# Patient Record
Sex: Male | Born: 1945 | Race: White | Hispanic: No | Marital: Married | Smoking: Current every day smoker
Health system: Southern US, Community
[De-identification: ages and names within clinical notes are randomized; demographics above are authoritative.]

## PROBLEM LIST (undated history)

## (undated) DIAGNOSIS — M549 Dorsalgia, unspecified: Secondary | ICD-10-CM

## (undated) DIAGNOSIS — C801 Malignant (primary) neoplasm, unspecified: Secondary | ICD-10-CM

## (undated) DIAGNOSIS — D649 Anemia, unspecified: Secondary | ICD-10-CM

## (undated) DIAGNOSIS — C189 Malignant neoplasm of colon, unspecified: Secondary | ICD-10-CM

## (undated) DIAGNOSIS — E785 Hyperlipidemia, unspecified: Secondary | ICD-10-CM

## (undated) DIAGNOSIS — C61 Malignant neoplasm of prostate: Secondary | ICD-10-CM

## (undated) DIAGNOSIS — I10 Essential (primary) hypertension: Secondary | ICD-10-CM

## (undated) HISTORY — DX: Hyperlipidemia, unspecified: E78.5

## (undated) HISTORY — PX: TRANSURETHRAL RESECTION OF PROSTATE: SHX73

## (undated) HISTORY — PX: CATARACT EXTRACTION, BILATERAL: SHX1313

## (undated) HISTORY — DX: Malignant (primary) neoplasm, unspecified: C80.1

## (undated) HISTORY — PX: CYSTOSCOPY: SUR368

## (undated) HISTORY — DX: Anemia, unspecified: D64.9

---

## 2011-05-29 HISTORY — PX: COLON SURGERY: SHX602

## 2011-07-04 HISTORY — PX: THYROIDECTOMY: SHX17

## 2012-01-24 LAB — HM COLONOSCOPY

## 2013-07-07 ENCOUNTER — Other Ambulatory Visit: Payer: Self-pay | Admitting: Orthopedic Surgery

## 2013-07-07 DIAGNOSIS — M545 Low back pain, unspecified: Secondary | ICD-10-CM

## 2013-07-13 ENCOUNTER — Ambulatory Visit
Admission: RE | Admit: 2013-07-13 | Discharge: 2013-07-13 | Disposition: A | Discharge status: Home / Self Care | Payer: MEDICARE | Source: Ambulatory Visit | Attending: Orthopedic Surgery | Admitting: Orthopedic Surgery

## 2013-07-13 DIAGNOSIS — M545 Low back pain, unspecified: Secondary | ICD-10-CM

## 2013-07-17 DIAGNOSIS — M961 Postlaminectomy syndrome, not elsewhere classified: Secondary | ICD-10-CM | POA: Insufficient documentation

## 2013-09-23 ENCOUNTER — Emergency Department (HOSPITAL_COMMUNITY)
Admission: EM | Admit: 2013-09-23 | Discharge: 2013-09-23 | Disposition: A | Discharge status: Home / Self Care | Payer: MEDICARE | Attending: Emergency Medicine | Admitting: Emergency Medicine

## 2013-09-23 ENCOUNTER — Emergency Department (HOSPITAL_COMMUNITY): Payer: MEDICARE

## 2013-09-23 ENCOUNTER — Encounter (HOSPITAL_COMMUNITY): Payer: Self-pay | Admitting: Emergency Medicine

## 2013-09-23 DIAGNOSIS — Z79899 Other long term (current) drug therapy: Secondary | ICD-10-CM | POA: Insufficient documentation

## 2013-09-23 DIAGNOSIS — F29 Unspecified psychosis not due to a substance or known physiological condition: Secondary | ICD-10-CM | POA: Insufficient documentation

## 2013-09-23 DIAGNOSIS — R443 Hallucinations, unspecified: Secondary | ICD-10-CM | POA: Insufficient documentation

## 2013-09-23 DIAGNOSIS — R4182 Altered mental status, unspecified: Secondary | ICD-10-CM | POA: Insufficient documentation

## 2013-09-23 DIAGNOSIS — R209 Unspecified disturbances of skin sensation: Secondary | ICD-10-CM | POA: Insufficient documentation

## 2013-09-23 DIAGNOSIS — M549 Dorsalgia, unspecified: Secondary | ICD-10-CM | POA: Insufficient documentation

## 2013-09-23 DIAGNOSIS — R509 Fever, unspecified: Secondary | ICD-10-CM | POA: Insufficient documentation

## 2013-09-23 DIAGNOSIS — Z87891 Personal history of nicotine dependence: Secondary | ICD-10-CM | POA: Insufficient documentation

## 2013-09-23 DIAGNOSIS — I10 Essential (primary) hypertension: Secondary | ICD-10-CM | POA: Insufficient documentation

## 2013-09-23 HISTORY — DX: Dorsalgia, unspecified: M54.9

## 2013-09-23 HISTORY — DX: Essential (primary) hypertension: I10

## 2013-09-23 LAB — CBC WITH DIFFERENTIAL/PLATELET
BASOS ABS: 0 10*3/uL (ref 0.0–0.1)
BASOS PCT: 0 % (ref 0–1)
EOS ABS: 0.5 10*3/uL (ref 0.0–0.7)
Eosinophils Relative: 4 % (ref 0–5)
HCT: 28.9 % — ABNORMAL LOW (ref 39.0–52.0)
Hemoglobin: 9.6 g/dL — ABNORMAL LOW (ref 13.0–17.0)
Lymphocytes Relative: 18 % (ref 12–46)
Lymphs Abs: 2.2 10*3/uL (ref 0.7–4.0)
MCH: 29.1 pg (ref 26.0–34.0)
MCHC: 33.2 g/dL (ref 30.0–36.0)
MCV: 87.6 fL (ref 78.0–100.0)
Monocytes Absolute: 1.1 10*3/uL — ABNORMAL HIGH (ref 0.1–1.0)
Monocytes Relative: 9 % (ref 3–12)
NEUTROS ABS: 8.4 10*3/uL — AB (ref 1.7–7.7)
NEUTROS PCT: 69 % (ref 43–77)
PLATELETS: 484 10*3/uL — AB (ref 150–400)
RBC: 3.3 MIL/uL — ABNORMAL LOW (ref 4.22–5.81)
RDW: 13.6 % (ref 11.5–15.5)
WBC: 12.2 10*3/uL — ABNORMAL HIGH (ref 4.0–10.5)

## 2013-09-23 LAB — COMPREHENSIVE METABOLIC PANEL
ALBUMIN: 2.9 g/dL — AB (ref 3.5–5.2)
ALT: 26 U/L (ref 0–53)
AST: 22 U/L (ref 0–37)
Alkaline Phosphatase: 67 U/L (ref 39–117)
BUN: 19 mg/dL (ref 6–23)
CHLORIDE: 99 meq/L (ref 96–112)
CO2: 25 mEq/L (ref 19–32)
Calcium: 8.9 mg/dL (ref 8.4–10.5)
Creatinine, Ser: 1.04 mg/dL (ref 0.50–1.35)
GFR calc Af Amer: 83 mL/min — ABNORMAL LOW (ref >90)
GFR calc non Af Amer: 72 mL/min — ABNORMAL LOW (ref >90)
Glucose, Bld: 97 mg/dL (ref 70–99)
POTASSIUM: 4.8 meq/L (ref 3.7–5.3)
SODIUM: 137 meq/L (ref 137–147)
TOTAL PROTEIN: 6.6 g/dL (ref 6.0–8.3)
Total Bilirubin: <0.2 mg/dL — ABNORMAL LOW (ref 0.3–1.2)

## 2013-09-23 LAB — RAPID URINE DRUG SCREEN, HOSP PERFORMED
AMPHETAMINES: NOT DETECTED
BENZODIAZEPINES: NOT DETECTED
Barbiturates: NOT DETECTED
Cocaine: NOT DETECTED
OPIATES: NOT DETECTED
TETRAHYDROCANNABINOL: NOT DETECTED

## 2013-09-23 LAB — URINALYSIS, ROUTINE W REFLEX MICROSCOPIC
BILIRUBIN URINE: NEGATIVE
Glucose, UA: NEGATIVE mg/dL
HGB URINE DIPSTICK: NEGATIVE
Ketones, ur: NEGATIVE mg/dL
Leukocytes, UA: NEGATIVE
Nitrite: NEGATIVE
PH: 7 (ref 5.0–8.0)
Protein, ur: NEGATIVE mg/dL
SPECIFIC GRAVITY, URINE: 1.023 (ref 1.005–1.030)
Urobilinogen, UA: 0.2 mg/dL (ref 0.0–1.0)

## 2013-09-23 LAB — TSH: TSH: 0.224 u[IU]/mL — ABNORMAL LOW (ref 0.350–4.500)

## 2013-09-23 LAB — I-STAT VENOUS BLOOD GAS, ED
Acid-Base Excess: 2 mmol/L (ref 0.0–2.0)
BICARBONATE: 24.8 meq/L — AB (ref 20.0–24.0)
O2 Saturation: 99 %
PCO2 VEN: 32.8 mmHg — AB (ref 45.0–50.0)
PO2 VEN: 125 mmHg — AB (ref 30.0–45.0)
TCO2: 26 mmol/L (ref 0–100)
pH, Ven: 7.486 — ABNORMAL HIGH (ref 7.250–7.300)

## 2013-09-23 LAB — ETHANOL: Alcohol, Ethyl (B): <11 mg/dL (ref 0–11)

## 2013-09-23 LAB — I-STAT CG4 LACTIC ACID, ED: Lactic Acid, Venous: 1.12 mmol/L (ref 0.5–2.2)

## 2013-09-23 LAB — T4, FREE: FREE T4: 1.24 ng/dL (ref 0.80–1.80)

## 2013-09-23 LAB — AMMONIA: Ammonia: 16 umol/L (ref 11–60)

## 2013-09-23 MED ORDER — GADOBENATE DIMEGLUMINE 529 MG/ML IV SOLN
20.0000 mL | Freq: Once | INTRAVENOUS | Status: AC
  Administered 2013-09-23: 20 mL via INTRAVENOUS

## 2013-09-23 NOTE — ED Notes (Signed)
NOTIFIED DR. Jeneen Rinks IN PERSON OF PATIENTS LAB RESULTS OF CG4+ LACTIC ACIS ,@17 :43 PM ,09/23/2013.

## 2013-09-23 NOTE — ED Notes (Signed)
Per pt's wife, pt wanting to leave AMA.  Resident made aware.

## 2013-09-23 NOTE — ED Notes (Signed)
Pt presents to department for evaluation of altered mental status. Recently had spinal fusion surgery last Monday at [facility]. Wife now states he is confused, and not acting like himself. Pt states 3/10 back pain upon arrival. Pt is alert and able to answer questions correctly in triage.

## 2013-09-23 NOTE — ED Notes (Signed)
Dr. James at bedside  

## 2013-09-23 NOTE — ED Provider Notes (Signed)
CSN: [id number]     Arrival date & time 09/23/13  1505 History   First MD Initiated Contact with Patient 09/23/13 1524     Chief Complaint  Patient presents with  . Altered Mental Status     (Consider location/radiation/quality/duration/timing/severity/associated sxs/prior Treatment) HPI  This is a 68 y.o. male with PMH of thyroid cancer status post thyroidectomy, colon cancer status post colectomy, chronic back pain 9 days status post fusion of lumbar, sacral spine, presenting with altered mental status. Time of onset 2 days ago. Mostly occurs at home.  Described as hallucinations, delusions. Examples of hallucinations are "voices," and "seeing my dead daughter hug my wife."  Delusions include that people are attempting to tie him down to chairs.  Of note, tomorrow marks the patient's deceased daughter's birthday. Also, patient quit smoking on the day of his surgery. The complaints do not fit any temporal pattern. They do not seem to be aggravated or alleviated with any other factors. He denies headache, vision change, which weakness, change in bowel or bladder function, chest pain, shortness breath, abdominal pain, nausea, or vomiting. He does endorse some numbness in the right alert surety. He does endorse fever, chills. 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 Negative for head trauma.  Past Medical History  Diagnosis Date  . Back pain   . Hypertension    History reviewed. No pertinent past surgical history. No family history on file. History  Substance Use Topics  . Smoking status: Former Smoker    Types: Cigarettes  . Smokeless tobacco: Not on file  . Alcohol Use: No    Review of Systems  Constitutional: Positive for fever and chills.  HENT: Negative for facial swelling.   Eyes: Negative for pain.  Respiratory: Negative for chest tightness and shortness of breath.   Cardiovascular: Negative for chest pain.   Gastrointestinal: Negative for nausea and vomiting.  Genitourinary: Negative for dysuria.  Musculoskeletal: Positive for back pain.  Neurological: Negative for headaches.  Psychiatric/Behavioral: Positive for hallucinations and confusion. The patient is hyperactive.       Allergies  Review of patient's allergies indicates no known allergies.  Home Medications   Prior to Admission medications   Not on File   BP 95/53  Pulse 77  Temp(Src) 99.1 F (37.3 C) (Rectal)  Resp 18  SpO2 96% Physical Exam  Constitutional: He is oriented to person, place, and time. He appears well-developed and well-nourished. No distress.  HENT:  Head: Normocephalic and atraumatic.  Mouth/Throat: No oropharyngeal exudate.  Eyes: Conjunctivae are normal. Pupils are equal, round, and reactive to light. No scleral icterus.  Neck: Normal range of motion. No tracheal deviation present. No thyromegaly present.  Cardiovascular: Normal rate, regular rhythm and normal heart sounds.  Exam reveals no gallop and no friction rub.   No murmur heard. Pulmonary/Chest: Effort normal and breath sounds normal. No stridor. No respiratory distress. He has no wheezes. He has no rales. He exhibits no tenderness.  Abdominal: Soft. He exhibits no distension and no mass. There is no tenderness. There is no rebound and no guarding.  Musculoskeletal: Normal range of motion. He exhibits no edema.  Neurological: He is alert and oriented to person, place, and time. He has normal strength. A sensory deficit ( decreased sensation of right foot) is present. No cranial nerve deficit. Coordination and gait normal. GCS eye subscore is 4. GCS verbal subscore is 5. GCS motor subscore is 6.  Reflex Scores:      Patellar reflexes are 3+ on the right side and 0 on the left side. Skin: Skin is warm and dry. He is not diaphoretic.  Drain site in the left lumbar paraspinal area is well healed, dry, without signs of infection. The long midline  fusion surgical site is well approximated, appears to be healing well. About midway down the incision, positive for tenderness to palpation, erythema, minor swelling. Negative for drainage.    ED Course  Procedures (including critical care time) Labs Review Labs Reviewed  CBC WITH DIFFERENTIAL  COMPREHENSIVE METABOLIC PANEL  TSH  T4, FREE  URINE RAPID DRUG SCREEN (HOSP PERFORMED)  ETHANOL  AMMONIA  BLOOD GAS, VENOUS  URINALYSIS, ROUTINE W REFLEX MICROSCOPIC  I-STAT CG4 LACTIC ACID, ED    Imaging Review No results found.   EKG Interpretation None      MDM   Final diagnoses:  None    This is a 68 y.o. male with PMH of thyroid cancer status post thyroidectomy, colon cancer status post colectomy, chronic back pain 9 days status post fusion of lumbar, sacral spine, presenting with altered mental status. Time of onset 2 days ago. Mostly occurs at home.  Described as hallucinations, delusions. Examples of hallucinations are "voices," and "seeing my dead daughter hug my wife."  Delusions include that people are attempting to tie him down to chairs.  Of note, tomorrow marks the patient's deceased daughter's birthday. Also, patient quit smoking on the day of his surgery. The complaints do not fit any temporal pattern. They do not seem to be aggravated or alleviated with any other factors. He denies headache, vision change, which weakness, change in bowel or bladder function, chest pain, shortness breath, abdominal pain, nausea, or vomiting. He does endorse some numbness in the right alert surety. He does endorse fever, chills. 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 Negative for head trauma.  Examination as above. Vital signs are within normal limits, including a normal rectal temperature. Neurologic exam is grossly within normal limits, with the exception of some decreased sensation in the right lower extremity, as well as  decreased patellar reflex in the left alert surety. Patient ambulates without complication. The surgical site is grossly normal except for an area of tenderness to palpation, minor swelling.  Differential diagnosis is broad at this time but includes acidosis, infection, stroke, withdrawal, intoxication, cardiac etiology. I strongly cardiac or stroke as the etiology at this time. Considering recent surgery, findings on exam, history of cancer, will obtain CT head. Will also obtain chest x-ray, EKG, basic labs, thyroid testing, testing of urine, MRI of the lumbar spine with contrast. At this time, patient is exhibiting no abnormalities in his mental status, hallucinations, or delusions. I will continue to monitor closely, followup on testing.  Patient is not acidotic, has no evidence of urinary tract infection, is afebrile, has low TSH, but normal free T4. His drug test is negative. Chest x-ray is normal. MRI reveals no gross abnormalities of the spinal canal, but does reveal small amount of fluid in the soft tissues along the operative approach.  On reevaluation, patient now states that he was taking oxycodone for short amount of time, but has not in 3-4 days. This does coincide with the time of onset of his symptoms. I've spoken with Dr. Patrice Paradise with the patient's spine group and he does not believe that this presentation, along with the MRI represents an  infectious process of the surgical site. I agree with this. His office will see the patient tomorrow. I presented to bedside and given the patient and his wife updates. I believe that he is stable for discharge, as long as he follows up tomorrow with the spine clinic, his PCP within the next few days. They agree to this.  Pt stable for discharge, FU.  All questions answered.  Return precautions given.  I have discussed case and care has been guided by my attending physician, Dr. Jeneen Rinks.  Doy Hutching, MD 09/24/13 [phone]

## 2013-09-23 NOTE — ED Notes (Signed)
Rounded on pt.  Pt continues to be in MRI.

## 2013-09-23 NOTE — Discharge Instructions (Signed)
Altered Mental Status °Altered mental status most often refers to an abnormal change in your responsiveness and awareness. It can affect your speech, thought, mobility, memory, attention span, or alertness. It can range from slight confusion to complete unresponsiveness (coma). Altered mental status can be a sign of a serious underlying medical condition. Rapid evaluation and medical treatment is necessary for patients having an altered mental status. °CAUSES  °· Low blood sugar (hypoglycemia) or diabetes. °· Severe loss of body fluids (dehydration) or a body salt (electrolyte) imbalance. °· A stroke or other neurologic problem, such as dementia or delirium. °· A head injury or tumor. °· A drug or alcohol overdose. °· Exposure to toxins or poisons. °· Depression, anxiety, and stress. °· A low oxygen level (hypoxia). °· An infection. °· Blood loss. °· Twitching or shaking (seizure). °· Heart problems, such as heart attack or heart rhythm problems (arrhythmias). °· A body temperature that is too low or too high (hypothermia or hyperthermia). °DIAGNOSIS  °A diagnosis is based on your history, symptoms, physical and neurologic examinations, and diagnostic tests. Diagnostic tests may include: °· Measurement of your blood pressure, pulse, breathing, and oxygen levels (vital signs). °· Blood tests. °· Urine tests. °· X-ray exams. °· A computerized magnetic scan (magnetic resonance imaging, MRI). °· A computerized X-ray scan (computed tomography, CT scan). °TREATMENT  °Treatment will depend on the cause. Treatment may include: °· Management of an underlying medical or mental health condition. °· Critical care or support in the hospital. °HOME CARE INSTRUCTIONS  °· Only take over-the-counter or prescription medicines for pain, discomfort, or fever as directed by your caregiver. °· Manage underlying conditions as directed by your caregiver. °· Eat a healthy, well-balanced diet to maintain strength. °· Join a support group or  prevention program to cope with the condition or trauma that caused the altered mental status. Ask your caregiver to help choose a program that works for you. °· Follow up with your caregiver for further examination, therapy, or testing as directed. °SEEK MEDICAL CARE IF:  °· You feel unwell or have chills. °· You or your family notice a change in your behavior or your alertness. °· You have trouble following your caregiver's treatment plan. °· You have questions or concerns. °SEEK IMMEDIATE MEDICAL CARE IF:  °· You have a rapid heartbeat or have chest pain. °· You have difficulty breathing. °· You have a fever. °· You have a headache with a stiff neck. °· You cough up blood. °· You have blood in your urine or stool. °· You have severe agitation or confusion. °MAKE SURE YOU:  °· Understand these instructions. °· Will watch your condition. °· Will get help right away if you are not doing well or get worse. °Document Released: 11/01/2009 Document Revised: 08/06/2011 Document Reviewed: 11/01/2009 °[employer]® Patient Information ©2014 [employer]. ° °

## 2013-09-23 NOTE — ED Notes (Signed)
Dr. Sherlyn Lick paged to [facility] @ [phone] Rhea Bleacher

## 2013-09-23 NOTE — ED Notes (Addendum)
Pt back from MRI 

## 2013-09-29 ENCOUNTER — Other Ambulatory Visit (INDEPENDENT_AMBULATORY_CARE_PROVIDER_SITE_OTHER): Payer: MEDICARE

## 2013-09-29 ENCOUNTER — Encounter: Payer: Self-pay | Admitting: Internal Medicine

## 2013-09-29 ENCOUNTER — Ambulatory Visit (INDEPENDENT_AMBULATORY_CARE_PROVIDER_SITE_OTHER): Payer: MEDICARE | Admitting: Internal Medicine

## 2013-09-29 VITALS — BP 142/80 | HR 71 | Temp 98.1°F | Resp 16 | Ht 71.0 in | Wt 207.5 lb

## 2013-09-29 DIAGNOSIS — N138 Other obstructive and reflux uropathy: Secondary | ICD-10-CM

## 2013-09-29 DIAGNOSIS — Z85048 Personal history of other malignant neoplasm of rectum, rectosigmoid junction, and anus: Secondary | ICD-10-CM | POA: Insufficient documentation

## 2013-09-29 DIAGNOSIS — E039 Hypothyroidism, unspecified: Secondary | ICD-10-CM | POA: Insufficient documentation

## 2013-09-29 DIAGNOSIS — Z85850 Personal history of malignant neoplasm of thyroid: Secondary | ICD-10-CM

## 2013-09-29 DIAGNOSIS — I10 Essential (primary) hypertension: Secondary | ICD-10-CM

## 2013-09-29 DIAGNOSIS — Z0000 Encounter for general adult medical examination without abnormal findings: Secondary | ICD-10-CM

## 2013-09-29 DIAGNOSIS — N139 Obstructive and reflux uropathy, unspecified: Secondary | ICD-10-CM

## 2013-09-29 DIAGNOSIS — Z85038 Personal history of other malignant neoplasm of large intestine: Secondary | ICD-10-CM

## 2013-09-29 DIAGNOSIS — N401 Enlarged prostate with lower urinary tract symptoms: Secondary | ICD-10-CM

## 2013-09-29 DIAGNOSIS — D510 Vitamin B12 deficiency anemia due to intrinsic factor deficiency: Secondary | ICD-10-CM

## 2013-09-29 DIAGNOSIS — D509 Iron deficiency anemia, unspecified: Secondary | ICD-10-CM

## 2013-09-29 DIAGNOSIS — Z23 Encounter for immunization: Secondary | ICD-10-CM

## 2013-09-29 DIAGNOSIS — E785 Hyperlipidemia, unspecified: Secondary | ICD-10-CM

## 2013-09-29 DIAGNOSIS — E890 Postprocedural hypothyroidism: Secondary | ICD-10-CM | POA: Insufficient documentation

## 2013-09-29 LAB — URINALYSIS, ROUTINE W REFLEX MICROSCOPIC
Hgb urine dipstick: NEGATIVE
Leukocytes, UA: NEGATIVE
Nitrite: NEGATIVE
PH: 5.5 (ref 5.0–8.0)
URINE GLUCOSE: NEGATIVE
Urobilinogen, UA: 0.2 (ref 0.0–1.0)

## 2013-09-29 LAB — FECAL OCCULT BLOOD, GUAIAC: Fecal Occult Blood: NEGATIVE

## 2013-09-29 LAB — CBC WITH DIFFERENTIAL/PLATELET
BASOS PCT: 0.4 % (ref 0.0–3.0)
Basophils Absolute: 0 10*3/uL (ref 0.0–0.1)
EOS ABS: 0.4 10*3/uL (ref 0.0–0.7)
Eosinophils Relative: 3.8 % (ref 0.0–5.0)
HEMATOCRIT: 32.4 % — AB (ref 39.0–52.0)
Hemoglobin: 10.7 g/dL — ABNORMAL LOW (ref 13.0–17.0)
LYMPHS ABS: 2 10*3/uL (ref 0.7–4.0)
Lymphocytes Relative: 19.9 % (ref 12.0–46.0)
MCHC: 33 g/dL (ref 30.0–36.0)
MCV: 88.2 fl (ref 78.0–100.0)
MONO ABS: 0.7 10*3/uL (ref 0.1–1.0)
Monocytes Relative: 6.4 % (ref 3.0–12.0)
NEUTROS PCT: 69.5 % (ref 43.0–77.0)
Neutro Abs: 7.1 10*3/uL (ref 1.4–7.7)
Platelets: 700 10*3/uL — ABNORMAL HIGH (ref 150.0–400.0)
RBC: 3.68 Mil/uL — AB (ref 4.22–5.81)
RDW: 13.8 % (ref 11.5–15.5)
WBC: 10.2 10*3/uL (ref 4.0–10.5)

## 2013-09-29 LAB — LIPID PANEL
CHOL/HDL RATIO: 4
Cholesterol: 143 mg/dL (ref 0–200)
HDL: 36.6 mg/dL — ABNORMAL LOW (ref >39.00)
LDL Cholesterol: 93 mg/dL (ref 0–99)
TRIGLYCERIDES: 68 mg/dL (ref 0.0–149.0)
VLDL: 13.6 mg/dL (ref 0.0–40.0)

## 2013-09-29 LAB — BASIC METABOLIC PANEL
BUN: 17 mg/dL (ref 6–23)
CHLORIDE: 104 meq/L (ref 96–112)
CO2: 30 meq/L (ref 19–32)
Calcium: 9.8 mg/dL (ref 8.4–10.5)
Creatinine, Ser: 1 mg/dL (ref 0.4–1.5)
GFR: 75.36 mL/min (ref >60.00)
Glucose, Bld: 95 mg/dL (ref 70–99)
Potassium: 4.7 mEq/L (ref 3.5–5.1)
Sodium: 141 mEq/L (ref 135–145)

## 2013-09-29 LAB — VITAMIN B12: Vitamin B-12: 678 pg/mL (ref 211–911)

## 2013-09-29 LAB — IBC PANEL
Iron: 29 ug/dL — ABNORMAL LOW (ref 42–165)
Saturation Ratios: 11.7 % — ABNORMAL LOW (ref 20.0–50.0)
Transferrin: 177.1 mg/dL — ABNORMAL LOW (ref 212.0–360.0)

## 2013-09-29 LAB — FOLATE: Folate: 18.3 ng/mL (ref >5.9)

## 2013-09-29 LAB — TSH: TSH: 0.17 u[IU]/mL — ABNORMAL LOW (ref 0.35–4.50)

## 2013-09-29 LAB — PSA: PSA: 3.18 ng/mL (ref 0.10–4.00)

## 2013-09-29 LAB — FERRITIN: Ferritin: 149.7 ng/mL (ref 22.0–322.0)

## 2013-09-29 MED ORDER — LEVOTHYROXINE SODIUM 100 MCG PO TABS: 100.0000 ug | ORAL_TABLET | Freq: Every day | ORAL | Status: DC

## 2013-09-29 MED ORDER — LEVOTHYROXINE SODIUM 100 MCG PO TABS: 200.0000 ug | ORAL_TABLET | Freq: Every day | ORAL | Status: DC

## 2013-09-29 MED ORDER — PRAVASTATIN SODIUM 20 MG PO TABS: 20.0000 mg | ORAL_TABLET | Freq: Every day | ORAL | Status: AC

## 2013-09-29 MED ORDER — FERRALET 90 90-1 MG PO TABS: 1.0000 | ORAL_TABLET | Freq: Every day | ORAL | Status: AC

## 2013-09-29 NOTE — Assessment & Plan Note (Signed)
His recent TSH was suppressed so I have lowered his dose to 200 mcg per day

## 2013-09-29 NOTE — Assessment & Plan Note (Signed)
FLP today Cont pravachol

## 2013-09-29 NOTE — Progress Notes (Signed)
Subjective:    Patient ID: Sergio Hews., male    DOB: Jul 27, 1945, 68 y.o.   MRN: [id number]  Anemia Presents for follow-up visit. There has been no abdominal pain, anorexia, bruising/bleeding easily, confusion, fever, leg swelling, light-headedness, malaise/fatigue, pallor, palpitations, paresthesias, pica or weight loss. Signs of blood loss that are not present include hematemesis, hematochezia and melena. Past treatments include nothing. Past medical history includes recent illness and recent surgery. There is no history of alcohol abuse. Procedure history includes colonoscopy.      Review of Systems  Constitutional: Negative.  Negative for fever, chills, weight loss, malaise/fatigue, diaphoresis, activity change, appetite change, fatigue and unexpected weight change.  HENT: Negative.   Eyes: Negative.   Respiratory: Negative.  Negative for cough, choking, chest tightness, shortness of breath, wheezing and stridor.   Cardiovascular: Negative.  Negative for chest pain, palpitations and leg swelling.  Gastrointestinal: Negative.  Negative for nausea, vomiting, abdominal pain, diarrhea, constipation, blood in stool, melena, hematochezia, abdominal distention, anal bleeding, rectal pain, anorexia and hematemesis.  Endocrine: Negative.   Genitourinary: Positive for difficulty urinating. Negative for dysuria, urgency, frequency, hematuria, flank pain, decreased urine volume, discharge, penile swelling, scrotal swelling, enuresis, genital sores, penile pain and testicular pain.       He complains of a weak urine stream with hesitation  Musculoskeletal: Negative.   Skin: Negative.  Negative for pallor.  Allergic/Immunologic: Negative.   Neurological: Negative for dizziness, syncope, speech difficulty, weakness, light-headedness, numbness, headaches and paresthesias.  Hematological: Negative.  Negative for adenopathy. Does not bruise/bleed easily.  Psychiatric/Behavioral: Negative.  Negative  for confusion.       Objective:   Physical Exam  Vitals reviewed. Constitutional: He is oriented to person, place, and time. He appears well-developed and well-nourished. No distress.  HENT:  Head: Normocephalic and atraumatic.  Mouth/Throat: Oropharynx is clear and moist. No oropharyngeal exudate.  Eyes: Conjunctivae are normal. Right eye exhibits no discharge. Left eye exhibits no discharge. No scleral icterus.  Neck: Trachea normal and normal range of motion. Neck supple. No JVD present. No tracheal deviation present. No mass and no thyromegaly present.  Cardiovascular: Normal rate, regular rhythm, normal heart sounds and intact distal pulses.  Exam reveals no gallop and no friction rub.   No murmur heard. Pulmonary/Chest: Effort normal and breath sounds normal. No stridor. No respiratory distress. He has no wheezes. He has no rales. He exhibits no tenderness.  Abdominal: Soft. Bowel sounds are normal. He exhibits no distension and no mass. There is no tenderness. There is no rebound and no guarding. Hernia confirmed negative in the right inguinal area and confirmed negative in the left inguinal area.  Genitourinary: Rectum normal, testes normal and penis normal. Rectal exam shows no external hemorrhoid, no internal hemorrhoid, no fissure, no mass, no tenderness and anal tone normal. Guaiac negative stool. 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 Prostate is not tender. Right testis shows no mass, no swelling and no tenderness. Right testis is descended. Left testis shows no mass, no swelling and no tenderness. Left testis is descended. Circumcised. No penile erythema or penile tenderness. No discharge found.  Musculoskeletal: Normal range of motion. He exhibits no edema and no tenderness.       Lumbar back: He exhibits deformity. He exhibits normal range of motion, no tenderness, no bony tenderness, no swelling, no edema, no laceration, no pain, no spasm and normal  pulse.       Back:  Lymphadenopathy:  He has no cervical adenopathy.       Right: No inguinal adenopathy present.       Left: No inguinal adenopathy present.  Neurological: He is oriented to person, place, and time.  Skin: Skin is warm and dry. No rash noted. He is not diaphoretic. No erythema. No pallor.  Psychiatric: He has a normal mood and affect. His behavior is normal. Judgment and thought content normal.      Lab Results  Component Value Date   WBC 12.2* 09/23/2013   HGB 9.6* 09/23/2013   HCT 28.9* 09/23/2013   PLT 484* 09/23/2013   GLUCOSE 97 09/23/2013   ALT 26 09/23/2013   AST 22 09/23/2013   NA 137 09/23/2013   K 4.8 09/23/2013   CL 99 09/23/2013   CREATININE 1.04 09/23/2013   BUN 19 09/23/2013   CO2 25 09/23/2013   TSH 0.224* 09/23/2013      Assessment & Plan:

## 2013-09-29 NOTE — Patient Instructions (Signed)
Hypothyroidism The thyroid is a large gland located in the lower front of your neck. The thyroid gland helps control metabolism. Metabolism is how your body handles food. It controls metabolism with the hormone thyroxine. When this gland is underactive (hypothyroid), it produces too little hormone.  CAUSES These include:   Absence or destruction of thyroid tissue.  Goiter due to iodine deficiency.  Goiter due to medications.  Congenital defects (since birth).  Problems with the pituitary. This causes a lack of TSH (thyroid stimulating hormone). This hormone tells the thyroid to turn out more hormone. SYMPTOMS  Lethargy (feeling as though you have no energy)  Cold intolerance  Weight gain (in spite of normal food intake)  Dry skin  Coarse hair  Menstrual irregularity (if severe, may lead to infertility)  Slowing of thought processes Cardiac problems are also caused by insufficient amounts of thyroid hormone. Hypothyroidism in the newborn is cretinism, and is an extreme form. It is important that this form be treated adequately and immediately or it will lead rapidly to retarded physical and mental development. DIAGNOSIS  To prove hypothyroidism, your caregiver may do blood tests and ultrasound tests. Sometimes the signs are hidden. It may be necessary for your caregiver to watch this illness with blood tests either before or after diagnosis and treatment. TREATMENT  Low levels of thyroid hormone are increased by using synthetic thyroid hormone. This is a safe, effective treatment. It usually takes about four weeks to gain the full effects of the medication. After you have the full effect of the medication, it will generally take another four weeks for problems to leave. Your caregiver may start you on low doses. If you have had heart problems the dose may be gradually increased. It is generally not an emergency to get rapidly to normal. HOME CARE INSTRUCTIONS   Take your  medications as your caregiver suggests. Let your caregiver know of any medications you are taking or start taking. Your caregiver will help you with dosage schedules.  As your condition improves, your dosage needs may increase. It will be necessary to have continuing blood tests as suggested by your caregiver.  Report all suspected medication side effects to your caregiver. SEEK MEDICAL CARE IF: Seek medical care if you develop:  Sweating.  Tremulousness (tremors).  Anxiety.  Rapid weight loss.  Heat intolerance.  Emotional swings.  Diarrhea.  Weakness. SEEK IMMEDIATE MEDICAL CARE IF:  You develop chest pain, an irregular heart beat (palpitations), or a rapid heart beat. MAKE SURE YOU:   Understand these instructions.  Will watch your condition.  Will get help right away if you are not doing well or get worse. Document Released: 05/14/2005 Document Revised: 08/06/2011 Document Reviewed: 01/02/2008 [facility] Patient Information 2014 [place]. Health Maintenance, Males A healthy lifestyle and preventative care can promote health and wellness.  Maintain regular health, dental, and eye exams.  Eat a healthy diet. Foods like vegetables, fruits, whole grains, low-fat dairy products, and lean protein foods contain the nutrients you need and are low in calories. Decrease your intake of foods high in solid fats, added sugars, and salt. Get information about a proper diet from your health care provider, if necessary.  Regular physical exercise is one of the most important things you can do for your health. Most adults should get at least 150 minutes of moderate-intensity exercise (any activity that increases your heart rate and causes you to sweat) each week. In addition, most adults need muscle-strengthening exercises on 2 or more  days a week.   Maintain a healthy weight. The body mass index (BMI) is a screening tool to identify possible weight problems. It provides an  estimate of body fat based on height and weight. Your health care provider can find your BMI and can help you achieve or maintain a healthy weight. For males 20 years and older:  A BMI below 18.5 is considered underweight.  A BMI of 18.5 to 24.9 is normal.  A BMI of 25 to 29.9 is considered overweight.  A BMI of 30 and above is considered obese.  Maintain normal blood lipids and cholesterol by exercising and minimizing your intake of saturated fat. Eat a balanced diet with plenty of fruits and vegetables. Blood tests for lipids and cholesterol should begin at age 68 and be repeated every 5 years. If your lipid or cholesterol levels are high, you are over 50, or you are at high risk for heart disease, you may need your cholesterol levels checked more frequently.Ongoing high lipid and cholesterol levels should be treated with medicines, if diet and exercise are not working.  If you smoke, find out from your health care provider how to quit. If you do not use tobacco, do not start.  Lung cancer screening is recommended for adults aged 30 80 years who are at high risk for developing lung cancer because of a history of smoking. A yearly low-dose CT scan of the lungs is recommended for people who have at least a 30-pack-year history of smoking and are a current smoker or have quit within the past 15 years. A pack year of smoking is smoking an average of 1 pack of cigarettes a day for 1 year (for example, a 30-pack-year history of smoking could mean smoking 1 pack a day for 30 years or 2 packs a day for 15 years). Yearly screening should continue until the smoker has stopped smoking for at least 15 years. Yearly screening should be stopped for people who develop a health problem that would prevent them from having lung cancer treatment.  If you choose to drink alcohol, do not have more than 2 drinks per day. One drink is considered to be 12 oz (360 mL) of beer, 5 oz (150 mL) of wine, or 1.5 oz (45 mL) of  liquor.  Avoid use of street drugs. Do not share needles with anyone. Ask for help if you need support or instructions about stopping the use of drugs.  High blood pressure causes heart disease and increases the risk of stroke. Blood pressure should be checked at least every 1 2 years. Ongoing high blood pressure should be treated with medicines if weight loss and exercise are not effective.  If you are 46 69 years old, ask your health care provider if you should take aspirin to prevent heart disease.  Diabetes screening involves taking a blood sample to check your fasting blood sugar level. This should be done once every 3 years after age 75, if you are at a normal weight and without risk factors for diabetes. Testing should be considered at a younger age or be carried out more frequently if you are overweight and have at least 1 risk factor for diabetes.  Colorectal cancer can be detected and often prevented. Most routine colorectal cancer screening begins at the age of 102 and continues through age 39. However, your health care provider may recommend screening at an earlier age if you have risk factors for colon cancer. On a yearly basis, your health  care provider may provide home test kits to check for hidden blood in the stool. A small camera at the end of a tube may be used to directly examine the colon (sigmoidoscopy or colonoscopy) to detect the earliest forms of colorectal cancer. Talk to your health care provider about this at age 55, when routine screening begins. A direct exam of the colon should be repeated every 5 10 years through age 60, unless early forms of pre-cancerous polyps or small growths are found.  People who are at an increased risk for hepatitis B should be screened for this virus. You are considered at high risk for hepatitis B if:  You were born in a country where hepatitis B occurs often. Talk with your health care provider about which countries are considered  high-risk.  Your parents were born in a high-risk country and you have not received a shot to protect against hepatitis B (hepatitis B vaccine).  You have HIV or AIDS.  You use needles to inject street drugs.  You live with, or have sex with, someone who has hepatitis B.  You are a man who has sex with other men (MSM).  You get hemodialysis treatment.  You take certain medicines for conditions like cancer, organ transplantation, and autoimmune conditions.  Hepatitis C blood testing is recommended for all people born from [birth year] through 1966 and any individual with known risk factors for hepatitis C.  Healthy men should no longer receive prostate-specific antigen (PSA) blood tests as part of routine cancer screening. Talk to your health care provider about prostate cancer screening.  Testicular cancer screening is not recommended for adolescents or adult males who have no symptoms. Screening includes self-exam, a health care provider exam, and other screening tests. Consult with your health care provider about any symptoms you have or any concerns you have about testicular cancer.  Practice safe sex. Use condoms and avoid high-risk sexual practices to reduce the spread of sexually transmitted infections (STIs).  Use sunscreen. Apply sunscreen liberally and repeatedly throughout the day. You should seek shade when your shadow is shorter than you. Protect yourself by wearing long sleeves, pants, a wide-brimmed hat, and sunglasses year round, whenever you are outdoors.  Tell your health care provider of new moles or changes in moles, especially if there is a change in shape or color. Also tell your provider if a mole is larger than the size of a pencil eraser.  A one-time screening for abdominal aortic aneurysm (AAA) and surgical repair of large AAAs by ultrasound is recommended for men aged 25 75 years who are current or former smokers.  Stay current with your vaccines  (immunizations). Document Released: 11/10/2007 Document Revised: 03/04/2013 Document Reviewed: 10/09/2010 [facility] Patient Information 2014 [place], [place].

## 2013-09-29 NOTE — Assessment & Plan Note (Signed)
I will recheck his CBC today and will look at this vitamin levels as well

## 2013-09-29 NOTE — Progress Notes (Signed)
Pre visit review using our clinic review tool, if applicable. No additional management support is needed unless otherwise documented below in the visit note. 

## 2013-09-29 NOTE — Assessment & Plan Note (Signed)
I will screen him for cancer with a PSA and a UA He does not want to start treating this at this time

## 2013-09-29 NOTE — Assessment & Plan Note (Addendum)

## 2013-09-29 NOTE — Assessment & Plan Note (Signed)
His BP is well controlled 

## 2013-09-29 NOTE — Assessment & Plan Note (Signed)
I will recheck his thyroglobulin ab today to see if there is concern for recurrence

## 2013-09-29 NOTE — Addendum Note (Signed)
Addended by: Janith Lima on: 09/29/2013 04:51 PM   Modules accepted: Orders

## 2013-09-30 ENCOUNTER — Telehealth: Payer: Self-pay | Admitting: Internal Medicine

## 2013-09-30 LAB — THYROGLOBULIN ANTIBODY: Thyroglobulin Ab: <20 IU/mL (ref <40.0)

## 2013-09-30 NOTE — Telephone Encounter (Signed)
Relevant patient education assigned to patient using Emmi. ° °

## 2013-10-06 NOTE — ED Provider Notes (Signed)
I saw and evaluated the patient, reviewed the resident's note and I agree with the findings and plan.   EKG Interpretation None      I have reviewed Dr. Jacelyn Grip note and I agree.    I evaluated patient face to face. He appears quite non toxic.  His incision site appears non infected.  He is neurologically intact.  I agree with disposition.  Tanna Furry, MD 10/06/13 [id number]

## 2013-10-09 ENCOUNTER — Telehealth: Payer: Self-pay

## 2013-10-09 DIAGNOSIS — D509 Iron deficiency anemia, unspecified: Secondary | ICD-10-CM

## 2013-10-09 NOTE — Telephone Encounter (Signed)
lmovm advising per MD

## 2013-10-09 NOTE — Telephone Encounter (Signed)
The patient called and is hoping to get orders for blood work for himself and for his wife : Sergio Herring (DOB [birth year]) put in.  They state they were told to come in for repeat labs.   Pt callback - [phone]

## 2013-10-09 NOTE — Telephone Encounter (Signed)
Her labs have already been ordered

## 2013-10-09 NOTE — Addendum Note (Signed)
Addended by: Janith Lima on: 10/09/2013 01:53 PM   Modules accepted: Orders

## 2013-10-12 ENCOUNTER — Other Ambulatory Visit (INDEPENDENT_AMBULATORY_CARE_PROVIDER_SITE_OTHER): Payer: MEDICARE

## 2013-10-12 ENCOUNTER — Encounter: Payer: Self-pay | Admitting: Internal Medicine

## 2013-10-12 DIAGNOSIS — D509 Iron deficiency anemia, unspecified: Secondary | ICD-10-CM

## 2013-10-12 LAB — CBC WITH DIFFERENTIAL/PLATELET
BASOS ABS: 0 10*3/uL (ref 0.0–0.1)
Basophils Relative: 0.5 % (ref 0.0–3.0)
Eosinophils Absolute: 0.8 10*3/uL — ABNORMAL HIGH (ref 0.0–0.7)
Eosinophils Relative: 7.8 % — ABNORMAL HIGH (ref 0.0–5.0)
HCT: 36 % — ABNORMAL LOW (ref 39.0–52.0)
Hemoglobin: 11.9 g/dL — ABNORMAL LOW (ref 13.0–17.0)
LYMPHS PCT: 21.1 % (ref 12.0–46.0)
Lymphs Abs: 2.1 10*3/uL (ref 0.7–4.0)
MCHC: 33.1 g/dL (ref 30.0–36.0)
MCV: 87.6 fl (ref 78.0–100.0)
MONOS PCT: 7.7 % (ref 3.0–12.0)
Monocytes Absolute: 0.8 10*3/uL (ref 0.1–1.0)
NEUTROS PCT: 62.9 % (ref 43.0–77.0)
Neutro Abs: 6.3 10*3/uL (ref 1.4–7.7)
PLATELETS: 345 10*3/uL (ref 150.0–400.0)
RBC: 4.12 Mil/uL — ABNORMAL LOW (ref 4.22–5.81)
RDW: 15 % (ref 11.5–15.5)
WBC: 10 10*3/uL (ref 4.0–10.5)

## 2014-01-20 ENCOUNTER — Ambulatory Visit (INDEPENDENT_AMBULATORY_CARE_PROVIDER_SITE_OTHER): Payer: MEDICARE | Admitting: Psychology

## 2014-01-20 DIAGNOSIS — F329 Major depressive disorder, single episode, unspecified: Secondary | ICD-10-CM

## 2014-01-20 DIAGNOSIS — R4189 Other symptoms and signs involving cognitive functions and awareness: Secondary | ICD-10-CM

## 2014-01-20 DIAGNOSIS — F909 Attention-deficit hyperactivity disorder, unspecified type: Secondary | ICD-10-CM

## 2014-01-20 DIAGNOSIS — F3289 Other specified depressive episodes: Secondary | ICD-10-CM

## 2014-02-02 DIAGNOSIS — I10 Essential (primary) hypertension: Secondary | ICD-10-CM | POA: Insufficient documentation

## 2014-03-16 ENCOUNTER — Ambulatory Visit (INDEPENDENT_AMBULATORY_CARE_PROVIDER_SITE_OTHER): Payer: MEDICARE | Admitting: Professional

## 2014-03-16 DIAGNOSIS — F4323 Adjustment disorder with mixed anxiety and depressed mood: Secondary | ICD-10-CM

## 2014-03-23 ENCOUNTER — Ambulatory Visit (INDEPENDENT_AMBULATORY_CARE_PROVIDER_SITE_OTHER): Payer: MEDICARE | Admitting: Professional

## 2014-03-23 DIAGNOSIS — F4323 Adjustment disorder with mixed anxiety and depressed mood: Secondary | ICD-10-CM

## 2014-05-04 ENCOUNTER — Ambulatory Visit: Payer: MEDICARE | Admitting: Professional

## 2014-05-18 ENCOUNTER — Ambulatory Visit (INDEPENDENT_AMBULATORY_CARE_PROVIDER_SITE_OTHER): Payer: MEDICARE | Admitting: Professional

## 2014-05-18 DIAGNOSIS — F4323 Adjustment disorder with mixed anxiety and depressed mood: Secondary | ICD-10-CM

## 2014-05-25 ENCOUNTER — Ambulatory Visit: Payer: MEDICARE | Admitting: Professional

## 2014-06-01 ENCOUNTER — Ambulatory Visit: Payer: MEDICARE | Admitting: Professional

## 2014-06-08 ENCOUNTER — Ambulatory Visit (INDEPENDENT_AMBULATORY_CARE_PROVIDER_SITE_OTHER): Payer: MEDICARE | Admitting: Professional

## 2014-06-08 DIAGNOSIS — F4323 Adjustment disorder with mixed anxiety and depressed mood: Secondary | ICD-10-CM | POA: Diagnosis not present

## 2014-06-10 DIAGNOSIS — F329 Major depressive disorder, single episode, unspecified: Secondary | ICD-10-CM | POA: Insufficient documentation

## 2014-06-10 DIAGNOSIS — F32A Depression, unspecified: Secondary | ICD-10-CM | POA: Insufficient documentation

## 2014-06-15 ENCOUNTER — Ambulatory Visit (INDEPENDENT_AMBULATORY_CARE_PROVIDER_SITE_OTHER): Payer: MEDICARE | Admitting: Professional

## 2014-06-15 DIAGNOSIS — F4323 Adjustment disorder with mixed anxiety and depressed mood: Secondary | ICD-10-CM

## 2014-06-29 ENCOUNTER — Ambulatory Visit (INDEPENDENT_AMBULATORY_CARE_PROVIDER_SITE_OTHER): Payer: MEDICARE | Admitting: Professional

## 2014-06-29 DIAGNOSIS — F4323 Adjustment disorder with mixed anxiety and depressed mood: Secondary | ICD-10-CM

## 2014-07-13 ENCOUNTER — Ambulatory Visit: Payer: MEDICARE | Admitting: Professional

## 2014-07-20 ENCOUNTER — Ambulatory Visit: Payer: MEDICARE | Admitting: Professional

## 2014-08-10 ENCOUNTER — Ambulatory Visit (INDEPENDENT_AMBULATORY_CARE_PROVIDER_SITE_OTHER): Payer: MEDICARE | Admitting: Psychology

## 2014-08-10 DIAGNOSIS — F4323 Adjustment disorder with mixed anxiety and depressed mood: Secondary | ICD-10-CM

## 2014-08-17 ENCOUNTER — Ambulatory Visit (INDEPENDENT_AMBULATORY_CARE_PROVIDER_SITE_OTHER): Payer: MEDICARE | Admitting: Psychology

## 2014-08-17 DIAGNOSIS — F4323 Adjustment disorder with mixed anxiety and depressed mood: Secondary | ICD-10-CM

## 2014-12-16 DIAGNOSIS — F17200 Nicotine dependence, unspecified, uncomplicated: Secondary | ICD-10-CM | POA: Insufficient documentation

## 2015-03-03 DIAGNOSIS — J309 Allergic rhinitis, unspecified: Secondary | ICD-10-CM | POA: Insufficient documentation

## 2015-03-23 ENCOUNTER — Encounter: Payer: Self-pay | Admitting: *Deleted

## 2015-07-15 DIAGNOSIS — N401 Enlarged prostate with lower urinary tract symptoms: Secondary | ICD-10-CM | POA: Insufficient documentation

## 2015-07-15 DIAGNOSIS — N529 Male erectile dysfunction, unspecified: Secondary | ICD-10-CM | POA: Insufficient documentation

## 2015-07-15 DIAGNOSIS — K4020 Bilateral inguinal hernia, without obstruction or gangrene, not specified as recurrent: Secondary | ICD-10-CM | POA: Insufficient documentation

## 2016-03-17 IMAGING — MR MR LUMBAR SPINE WO/W CM
5 of 7 series · 29 of 48 positions shown · IV contrast (20    MULTI)
Comparison: Preoperative MRI and CT from June 2013.
Postoperative radiographs 09/16/2013.

CLINICAL DATA: Severe postoperative pain. Some mental status
changes.

EXAM:
MRI LUMBAR SPINE WITHOUT AND WITH CONTRAST
TECHNIQUE: Multiplanar and multiecho pulse sequences of the lumbar spine were
obtained without and with intravenous contrast.
CONTRAST:  20mL MULTIHANCE GADOBENATE DIMEGLUMINE 529 MG/ML IV SOLN

[Series 3: T2 · sagittal · 4.0mm · 0.55mm/px · 5 of 14 slices shown (1 of 2)]
[im 1/14]
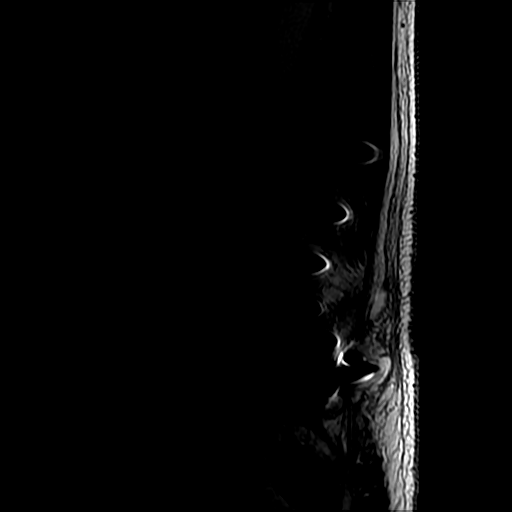
[im 4/14]
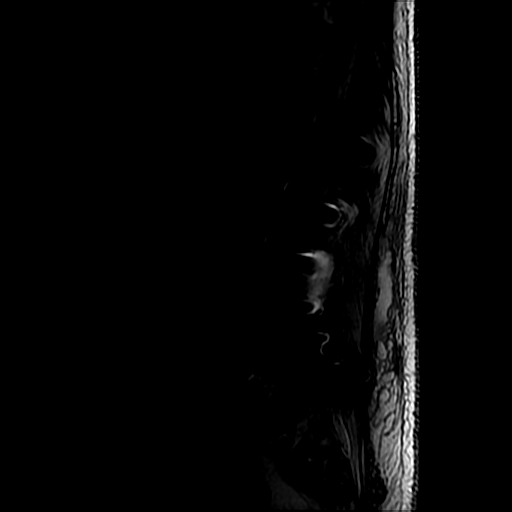
[im 7/14]
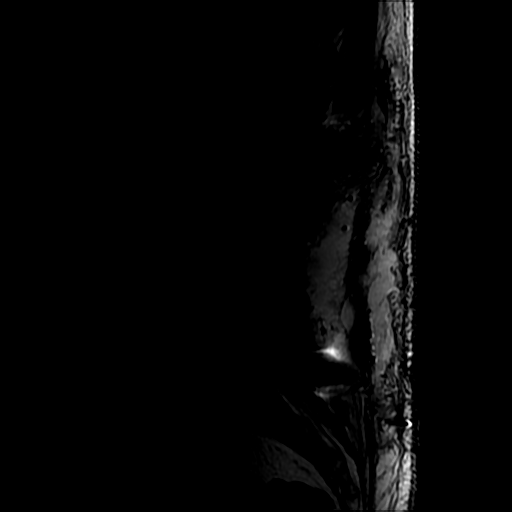
[im 10/14]
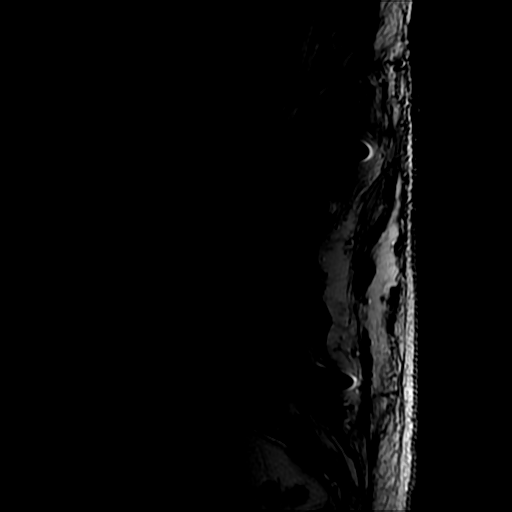
[im 14/14]
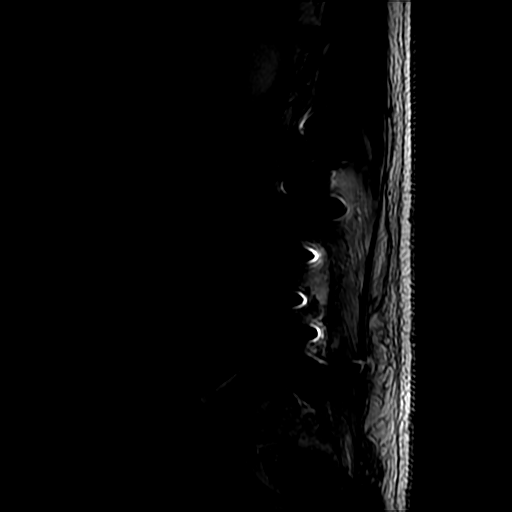

[Series 9: T1 · sagittal · 4.0mm · 0.55mm/px · 4 of 14 slices shown (1 of 2)]
[im 1/14]
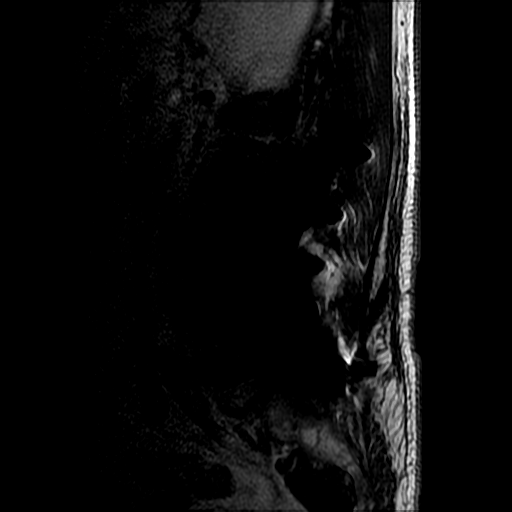
[im 5/14]
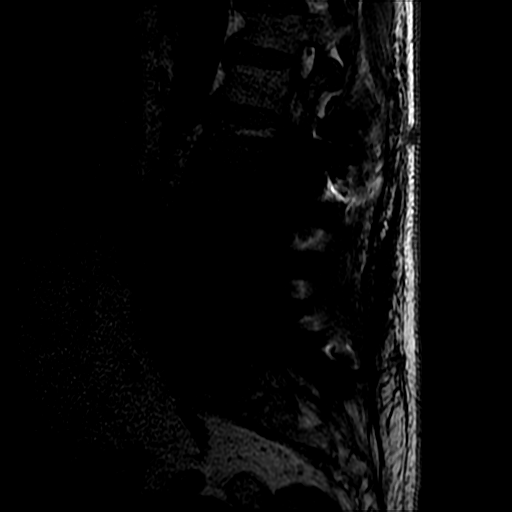
[im 9/14]
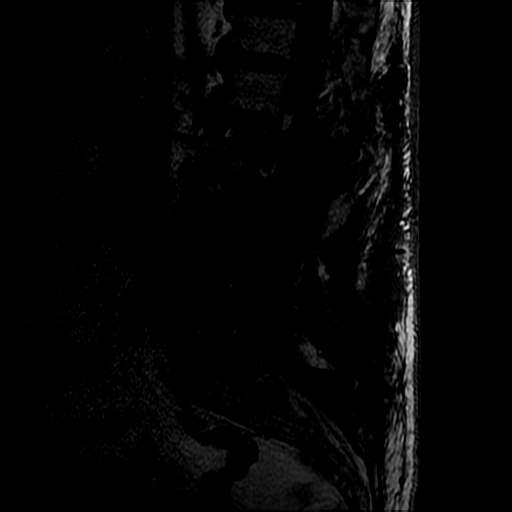
[im 14/14]
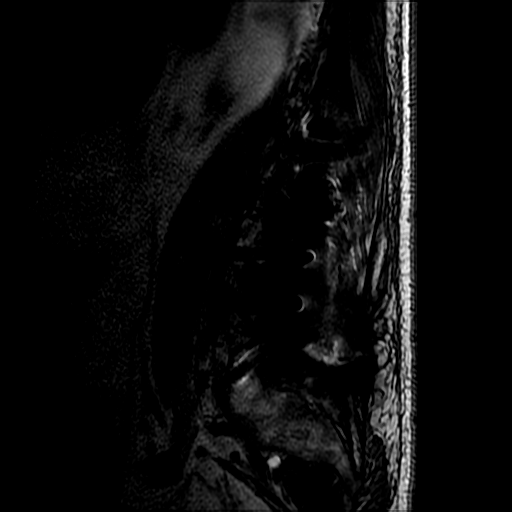

[Series 11: T2 · axial · 4.0mm · 0.78mm/px · z∈[-33,+125]mm · 8 of 33 slices shown (2 of 2)]
[im 1/33]
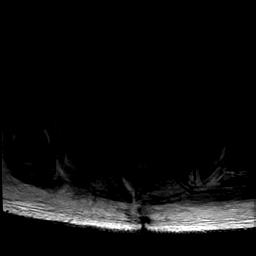
[im 4/33]
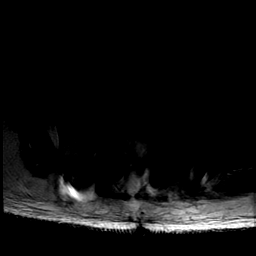
[im 11/33]
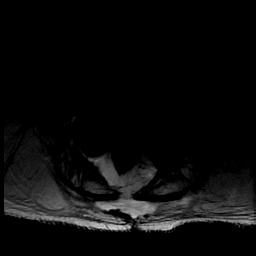
[im 15/33]
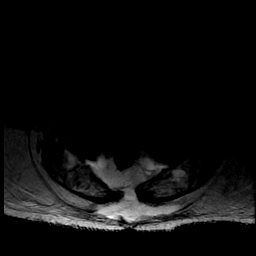
[im 18/33]
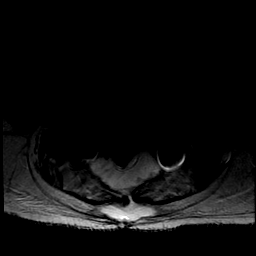
[im 22/33]
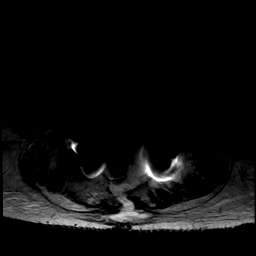
[im 29/33]
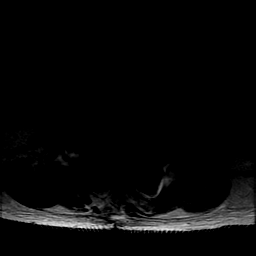
[im 33/33]
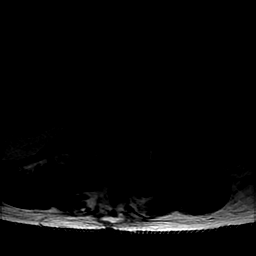

[Series 12: T1 · axial · 4.0mm · 0.78mm/px · z∈[-33,+125]mm · 8 of 33 slices shown (2 of 2)]
[im 1/33]
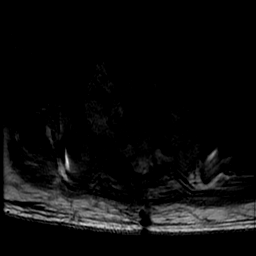
[im 4/33]
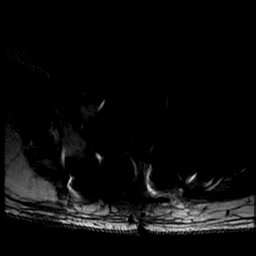
[im 11/33]
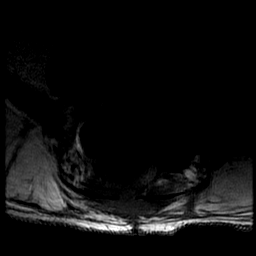
[im 15/33]
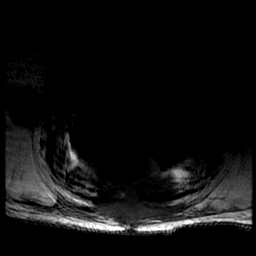
[im 18/33]
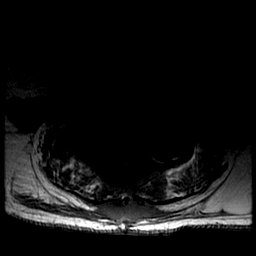
[im 22/33]
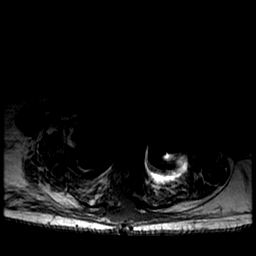
[im 29/33]
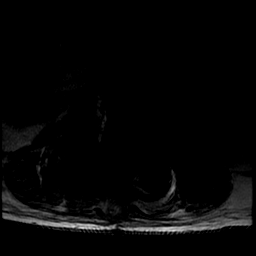
[im 33/33]
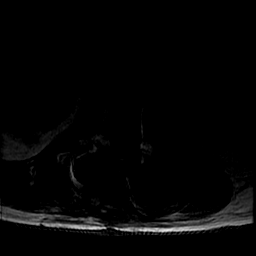

[Series 13: T1 post-contrast · sagittal · 4.0mm · 0.55mm/px · 4 of 14 slices shown]
[im 1/14]
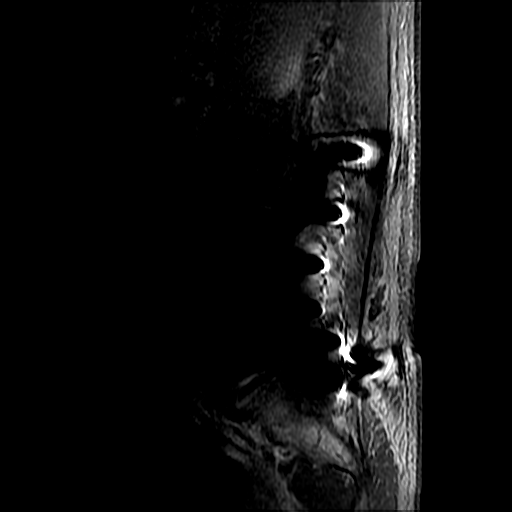
[im 5/14]
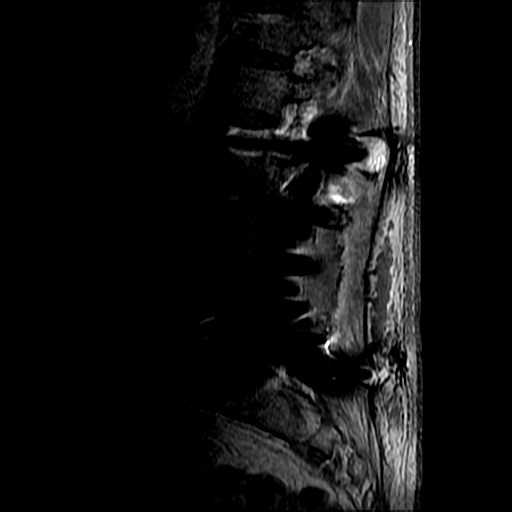
[im 9/14]
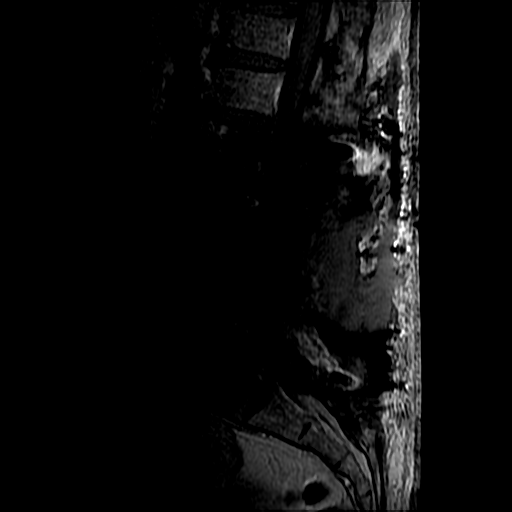
[im 14/14]
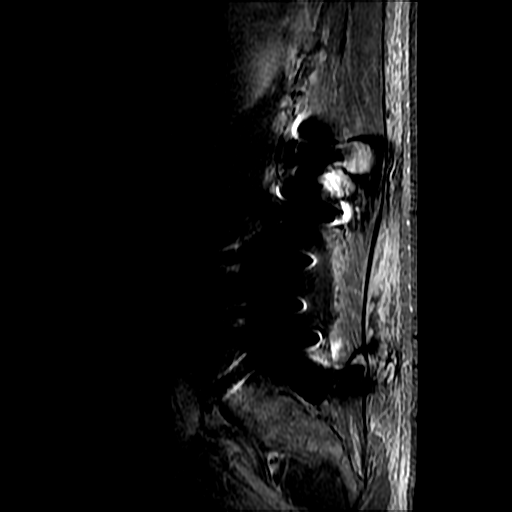

[29 of 48 positions shown; findings below may reference images not displayed]

FINDINGS: The patient had previously had pedicle screws and posterior rods
with interbody fusion material at L2-3. There have been previous
posterior decompression below that with they E residual screw
fragment in the right side of the L5 pedicle and vertebral body. The
surgery extends the fusion from L2 through the sacroiliac joints.
Discectomy was performed at L3-4, L4-5 and L5-S1 with placement of
interbody material.

The examination suffers from motion and metal artifact. Nonetheless,
no complication is evident regarding hardware placement. I think the
spinal canal is sufficiently patent throughout the region. I cannot
completely exclude some residual disc material indenting the thecal
sac at the L4-5 level. There is no evidence of ventral epidural
fluid collection or abscess.

There is fluid along the surgical approach in the midline posterior
to the spinal canal throughout the surgical region. This is most
pronounced at the L4 and L5 levels where nonenhancing fluid is quite
prominent. This could be serous material, infected material or
indicate CSF leak.
IMPRESSION: Extension of the fusion from L2 through the sacrum and across the
sacroiliac joints. Discectomy at L3-4, L4-5 and L5-S1. No
abnormality of hardware position is detected. Question some residual
disc material posterior to the L4-5 disc level, indenting the thecal
sac somewhat. No sign of hematoma or abscess within the spinal
canal.

Fluid in the soft tissues along the operative approach. This is most
prominent at the L4 and L5 level. This is nonspecific and could
represent seroma, infected fluid or evidence of CSF leak.

## 2016-04-09 DIAGNOSIS — R7303 Prediabetes: Secondary | ICD-10-CM | POA: Insufficient documentation

## 2016-06-07 DIAGNOSIS — I77819 Aortic ectasia, unspecified site: Secondary | ICD-10-CM | POA: Insufficient documentation

## 2016-08-16 DIAGNOSIS — K1379 Other lesions of oral mucosa: Secondary | ICD-10-CM | POA: Insufficient documentation

## 2016-09-05 DIAGNOSIS — Z96651 Presence of right artificial knee joint: Secondary | ICD-10-CM | POA: Insufficient documentation

## 2016-12-19 DIAGNOSIS — C187 Malignant neoplasm of sigmoid colon: Secondary | ICD-10-CM | POA: Insufficient documentation

## 2017-03-06 DIAGNOSIS — Z85850 Personal history of malignant neoplasm of thyroid: Secondary | ICD-10-CM | POA: Insufficient documentation

## 2017-06-04 ENCOUNTER — Other Ambulatory Visit: Payer: Self-pay | Admitting: Physician Assistant

## 2017-06-04 ENCOUNTER — Ambulatory Visit
Admission: RE | Admit: 2017-06-04 | Discharge: 2017-06-04 | Disposition: A | Discharge status: Home / Self Care | Payer: MEDICARE | Source: Ambulatory Visit | Attending: Physician Assistant | Admitting: Physician Assistant

## 2017-06-04 DIAGNOSIS — R1032 Left lower quadrant pain: Secondary | ICD-10-CM

## 2017-06-04 MED ORDER — IOPAMIDOL (ISOVUE-300) INJECTION 61%
125.0000 mL | Freq: Once | INTRAVENOUS | Status: AC | PRN
  Administered 2017-06-04: 125 mL via INTRAVENOUS

## 2017-07-11 DIAGNOSIS — M659 Synovitis and tenosynovitis, unspecified: Secondary | ICD-10-CM | POA: Insufficient documentation

## 2017-07-11 DIAGNOSIS — M65961 Unspecified synovitis and tenosynovitis, right lower leg: Secondary | ICD-10-CM | POA: Insufficient documentation

## 2017-11-07 DIAGNOSIS — N289 Disorder of kidney and ureter, unspecified: Secondary | ICD-10-CM | POA: Insufficient documentation

## 2018-03-03 DIAGNOSIS — C73 Malignant neoplasm of thyroid gland: Secondary | ICD-10-CM | POA: Insufficient documentation

## 2018-03-03 DIAGNOSIS — M6208 Separation of muscle (nontraumatic), other site: Secondary | ICD-10-CM | POA: Insufficient documentation

## 2018-04-01 DIAGNOSIS — K4090 Unilateral inguinal hernia, without obstruction or gangrene, not specified as recurrent: Secondary | ICD-10-CM | POA: Insufficient documentation

## 2018-05-27 DIAGNOSIS — T84012A Broken internal right knee prosthesis, initial encounter: Secondary | ICD-10-CM | POA: Insufficient documentation

## 2018-07-01 DIAGNOSIS — R1032 Left lower quadrant pain: Secondary | ICD-10-CM | POA: Insufficient documentation

## 2018-07-10 DIAGNOSIS — C61 Malignant neoplasm of prostate: Secondary | ICD-10-CM | POA: Insufficient documentation

## 2018-07-31 ENCOUNTER — Encounter: Payer: Self-pay | Admitting: Radiation Oncology

## 2018-07-31 NOTE — Progress Notes (Signed)
GU Location of Tumor / Histology: prostatic adenocarcinoma  If Prostate Cancer, Gleason Score is (3 + 3) and PSA is (5.69). Prostate volume: 82.5 mL.  [name]. with hx of elevated PSA, BPH with obstruction and ED.   09/2017  PSA  3.97 09/2016  PSA  3.69  Biopsies of prostate (if applicable) revealed:  ACCESSION NUMBER: [id number] RECEIVED: 06/26/2018 ORDERING PHYSICIAN: BRADLEY Loma Newton , MD PATIENT NAME: Herring, Sergio REPORT  FINAL PATHOLOGIC DIAGNOSIS A. PROSTATE, RIGHT APEX, NEEDLE BIOPSY: BENIGN PROSTATIC TISSUE.  B. 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 TUMOR INVOLVES ONE OF ONE CORE AND OCCUPIES APPROXIMATELY 5% OF THE TISSUE SAMPLE.  C. 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 TUMOR INVOLVES ONE OF ONE CORE AND OCCUPIES APPROXIMATELY 5-10% OF THE TISSUE SAMPLE.  D. PROSTATE, RIGHT LATERAL APEX, NEEDLE BIOPSY: BENIGN PROSTATIC TISSUE.  E. PROSTATE, RIGHT LATERAL MID, NEEDLE BIOPSY: BENIGN PROSTATIC TISSUE.  F. PROSTATE, RIGHT LATERAL BASE, NEEDLE BIOPSY: BENIGN PROSTATIC TISSUE.  G. PROSTATE, LEFT APEX, NEEDLE BIOPSY: BENIGN PROSTATIC TISSUE.  H. PROSTATE, LEFT MID, NEEDLE BIOPSY: BENIGN PROSTATIC TISSUE.  I.  PROSTATE, LEFT BASE, NEEDLE BIOPSY: BENIGN PROSTATIC TISSUE.  J. PROSTATE, LEFT LATERAL APEX, NEEDLE BIOPSY: BENIGN PROSTATIC TISSUE.  K. PROSTATE, LEFT LATERAL MID, NEEDLE BIOPSY: BENIGN PROSTATIC TISSUE.  L. 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 TUMOR INVOLVES ONE OF ONE CORE AND OCCUPIES APPROXIMATELY 40% OF THE TISSUE SAMPLE.  Past/Anticipated interventions by urology, if SPQ:[id number], Detrol LA,  cystoscopy, TURP, referral for consideration of  radiation therapy.  Past/Anticipated interventions by medical oncology, if any: no  Weight changes, if any: Denies  Bowel/Bladder complaints, if any: IPSS 25. SHIM 22. Reports dysuria since TURP is slowly improving. Reports he is uncertain if he has hematuria because he is taking pyridium. Denies urinary leakage or incontinence. Reports urinary frequency and urgency.   Nausea/Vomiting, if any: no  Pain issues, if any:  Reports a hx of back surgeries. Reports recently his lower back has "really been killing him."  SAFETY ISSUES:  Prior radiation? "took a pill for thyroid cancer." also had colon ca but it was excised with no further tx.  Pacemaker/ICD? no  Possible current pregnancy? no, male patient   Is the patient on methotrexate? no  Current Complaints / other details:  73 year old male. Father with hx of prostate cancer.

## 2018-08-01 ENCOUNTER — Ambulatory Visit
Admission: RE | Admit: 2018-08-01 | Discharge: 2018-08-01 | Disposition: A | Discharge status: Home / Self Care | Payer: MEDICARE | Source: Ambulatory Visit | Attending: Radiation Oncology | Admitting: Radiation Oncology

## 2018-08-01 ENCOUNTER — Other Ambulatory Visit: Payer: Self-pay | Admitting: Radiation Oncology

## 2018-08-01 ENCOUNTER — Encounter: Payer: Self-pay | Admitting: Radiation Oncology

## 2018-08-01 ENCOUNTER — Other Ambulatory Visit: Payer: Self-pay

## 2018-08-01 ENCOUNTER — Encounter: Payer: Self-pay | Admitting: Medical Oncology

## 2018-08-01 ENCOUNTER — Ambulatory Visit
Admission: RE | Admit: 2018-08-01 | Discharge: 2018-08-01 | Disposition: A | Discharge status: Home / Self Care | Payer: Self-pay | Source: Ambulatory Visit | Attending: Radiation Oncology | Admitting: Radiation Oncology

## 2018-08-01 DIAGNOSIS — C61 Malignant neoplasm of prostate: Secondary | ICD-10-CM | POA: Diagnosis not present

## 2018-08-01 DIAGNOSIS — M549 Dorsalgia, unspecified: Secondary | ICD-10-CM | POA: Diagnosis not present

## 2018-08-01 DIAGNOSIS — E785 Hyperlipidemia, unspecified: Secondary | ICD-10-CM | POA: Diagnosis not present

## 2018-08-01 DIAGNOSIS — Z85850 Personal history of malignant neoplasm of thyroid: Secondary | ICD-10-CM | POA: Insufficient documentation

## 2018-08-01 DIAGNOSIS — I10 Essential (primary) hypertension: Secondary | ICD-10-CM | POA: Insufficient documentation

## 2018-08-01 DIAGNOSIS — Z85038 Personal history of other malignant neoplasm of large intestine: Secondary | ICD-10-CM | POA: Insufficient documentation

## 2018-08-01 DIAGNOSIS — Z87891 Personal history of nicotine dependence: Secondary | ICD-10-CM | POA: Insufficient documentation

## 2018-08-01 DIAGNOSIS — Z79899 Other long term (current) drug therapy: Secondary | ICD-10-CM | POA: Diagnosis not present

## 2018-08-01 DIAGNOSIS — D649 Anemia, unspecified: Secondary | ICD-10-CM | POA: Diagnosis not present

## 2018-08-01 HISTORY — DX: Malignant neoplasm of prostate: C61

## 2018-08-01 HISTORY — DX: Malignant neoplasm of colon, unspecified: C18.9

## 2018-08-01 NOTE — Progress Notes (Signed)
See progress note under physician encounter. 

## 2018-08-01 NOTE — Progress Notes (Signed)
Introduced myself to Sergio Herring and his wife as the prostate nurse navigator and my role. He states he is not a surgical candidate due to having abdominal surgery for colon cancer. He is here to discuss his radiation options but brachytherapy may not be ideal due to his large prostate. He states he needs to have knee surgery and a hernia repair so he is ready to get treatment going. I will continue to follow and asked them to call me with questions or concerns.

## 2018-08-01 NOTE — Progress Notes (Signed)
Radiation Oncology         [phone]) [phone] ________________________________  Initial outpatient Consultation  Name: Sergio Herring. MRN: [id number]  Date: 08/01/2018  DOB: 1945-09-14  [id number], Sergio Right, MD  [name], *   REFERRING PHYSICIAN: [name], *  DIAGNOSIS: 73 y.o. gentleman with Stage T1c adenocarcinoma of the prostate with Gleason score of 3+3, and PSA of 5.69.    ICD-10-CM   1. Cancer of prostate [facility]) C61     HISTORY OF PRESENT ILLNESS: Sergio Herring. is a 73 y.o. male with a diagnosis of prostate cancer. He is an established patient of [name] at [facility] urology for BPH with obstruction and ED. He was noted to have an elevated PSA of 5.69 on 06/02/2018,  digital rectal examination was performed at that time revealing no nodules.  The patient proceeded to transrectal ultrasound with 12 biopsies of the prostate on 06/26/2018.  The prostate volume measured 72 cc.  Out of 12 core biopsies, 3 were positive.  The maximum Gleason score was 3+3, and this was seen in Herring mid, Herring base, and left lateral base.  He and [name] discussed his treatment options and felt the patient would not be a good candidate for surgery given his prior colon surgery. It was recommended that the patient undergo surgical management of his BPH with obstruction prior to radiation therapy consideration. He proceeded to TURP on 07/28/2018, with pathology of "prostate chips" taken during the procedure showing BPH with prominent stromal hyperplasia. He is scheduled to follow up with [name] on 08/11/2018.  The patient reviewed the biopsy results with his urologist and he has kindly been referred today for discussion of potential radiation treatment options.  Of note, he has a personal of thyroid cancer and colon cancer.   PREVIOUS RADIATION THERAPY: No  PAST MEDICAL HISTORY:  Past Medical History:  Diagnosis Date  . Anemia   . Back pain   . Cancer ([place])    thyroid  . Colon cancer ([place])   . Hyperlipidemia   . Hypertension   . Prostate cancer ([place])       PAST SURGICAL HISTORY: Past Surgical History:  Procedure Laterality Date  . COLON SURGERY  2013  . CYSTOSCOPY    . TRANSURETHRAL RESECTION OF PROSTATE      FAMILY HISTORY:  Family History  Problem Relation Age of Onset  . Prostate cancer Neg Hx     SOCIAL HISTORY:  Social History   Socioeconomic History  . Marital status: Married    Spouse name: Not on file  . Number of children: Not on file  . Years of education: Not on file  . Highest education level: Not on file  Occupational History  . Not on file  Social Needs  . Financial resource strain: Not on file  . Food insecurity:    Worry: Not on file    Inability: Not on file  . Transportation needs:    Medical: Not on file    Non-medical: Not on file  Tobacco Use  . Smoking status: Former Smoker    Types: Cigarettes  . Smokeless tobacco: Never Used  Substance and Sexual Activity  . Alcohol use: No  . Drug use: No  . Sexual activity: Not Currently  Lifestyle  . Physical activity:    Days per week: Not on file    Minutes per session: Not on file  . Stress: Not on file  Relationships  . Social connections:  Talks on phone: Not on file    Gets together: Not on file    Attends religious service: Not on file    Active member of club or organization: Not on file    Attends meetings of clubs or organizations: Not on file    Relationship status: Not on file  . Intimate partner violence:    Fear of current or ex partner: Not on file    Emotionally abused: Not on file    Physically abused: Not on file    Forced sexual activity: Not on file  Other Topics Concern  . Not on file  Social History Narrative  . Not on file    ALLERGIES: Patient has no known allergies.  MEDICATIONS:  Current Outpatient Medications  Medication Sig Dispense Refill  . buPROPion (WELLBUTRIN XL) 150 MG 24 hr tablet TAKE 1 TABLET BY  MOUTH EVERY DAY    . docusate sodium (COLACE) 100 MG capsule Take by mouth.    [name] ipratropium (ATROVENT) 0.03 % nasal spray Place into the nose.    . phenazopyridine (PYRIDIUM) 200 MG tablet Take by mouth.    . sertraline (ZOLOFT) 50 MG tablet Take by mouth.    . sildenafil (VIAGRA) 100 MG tablet Take 1 tablet by mouth 30 minutes prior to intercourse    . sulfamethoxazole-trimethoprim (BACTRIM DS,SEPTRA DS) 800-160 MG tablet Take by mouth.    . cephALEXin (KEFLEX) 500 MG capsule Take 500 mg by mouth 2 (two) times daily.    . cholecalciferol (VITAMIN D) 400 UNITS TABS tablet Take 400 Units by mouth daily.    [name] Fe Cbn-Fe Gluc-FA-B12-C-DSS (FERRALET 90) 90-1 MG TABS Take 1 tablet by mouth daily. 90 each 3  . levothyroxine (SYNTHROID, LEVOTHROID) 100 MCG tablet Take 2 tablets (200 mcg total) by mouth daily. 180 tablet 1  . Multiple Vitamin (MULTIVITAMIN WITH MINERALS) TABS tablet Take 1 tablet by mouth daily.    [name] PARoxetine (PAXIL) 40 MG tablet Take 40 mg by mouth at bedtime.    . pravastatin (PRAVACHOL) 20 MG tablet Take 1 tablet (20 mg total) by mouth at bedtime. 90 tablet 3  . ramipril (ALTACE) 10 MG capsule Take 10 mg by mouth daily.     No current facility-administered medications for this encounter.     REVIEW OF SYSTEMS:  On review of systems, the patient reports that he is doing well overall. He denies any chest pain, shortness of breath, cough, fevers, chills, night sweats, unintended weight changes. He denies any bowel disturbances, and denies abdominal pain, nausea or vomiting. He reports recent worsening lower back pain, noting a history of back surgeries. His IPSS was 25, indicating severe urinary symptoms. He is still recovering from TURP. He reports dysuria following the procedure is slowly improving. He also reports urinary frequency and urgency. His SHIM was 22, indicating he does not have erectile dysfunction. However, [name] office summary indicates he has a history of ED  (since 03/22/2017). A complete review of systems is obtained and is otherwise negative.    PHYSICAL EXAM:  Wt Readings from Last 3 Encounters:  08/01/18 207 lb 8 oz (94.1 kg)  09/29/13 207 lb 8 oz (94.1 kg)   Temp Readings from Last 3 Encounters:  08/01/18 98.6 F (37 C) (Oral)  09/29/13 98.1 F (36.7 C) (Oral)  09/23/13 99.1 F (37.3 C) (Rectal)   BP Readings from Last 3 Encounters:  08/01/18 129/67  09/29/13 (!) 142/80  09/23/13 (!) 91/48   Pulse Readings from  Last 3 Encounters:  08/01/18 72  09/29/13 71  09/23/13 79   Pain Assessment Pain Score: 0-No pain/10  In general this is a well appearing Caucasian gentleman in no acute distress. He is alert and oriented x4 and appropriate throughout the examination.    KPS = 100  100 - Normal; no complaints; no evidence of disease. 90   - Able to carry on normal activity; minor signs or symptoms of disease. 80   - Normal activity with effort; some signs or symptoms of disease. 47   - Cares for self; unable to carry on normal activity or to do active work. 60   - Requires occasional assistance, but is able to care for most of his personal needs. 50   - Requires considerable assistance and frequent medical care. 61   - Disabled; requires special care and assistance. 68   - Severely disabled; hospital admission is indicated although death not imminent. 7   - Very sick; hospital admission necessary; active supportive treatment necessary. 10   - Moribund; fatal processes progressing rapidly. 0     - Dead  Karnofsky DA, Abelmann Polson, Craver LS and Burchenal [facility] [phone]) The use of the nitrogen mustards in the palliative treatment of carcinoma: with particular reference to bronchogenic carcinoma Cancer 1 634-56  LABORATORY DATA:  Lab Results  Component Value Date   WBC 10.0 10/12/2013   HGB 11.9 (L) 10/12/2013   HCT 36.0 (L) 10/12/2013   MCV 87.6 10/12/2013   PLT 345.0 10/12/2013   Lab Results  Component Value Date   NA 141  09/29/2013   K 4.7 09/29/2013   CL 104 09/29/2013   CO2 30 09/29/2013   Lab Results  Component Value Date   ALT 26 09/23/2013   AST 22 09/23/2013   ALKPHOS 67 09/23/2013   BILITOT <0.2 (L) 09/23/2013     RADIOGRAPHY: No results found.    IMPRESSION/PLAN: 1. 73 y.o. gentleman with Stage T1c adenocarcinoma of the prostate with Gleason Score of 3+3, and PSA of 5.69. We discussed the patient's workup and outlines the nature of prostate cancer in this setting. The patient's T stage, Gleason's score, and PSA put him into the favorable risk group. In light of his prior colon surgery, we recommend external beam radiation or active surveillance. We discussed the available radiation techniques, and focused on the details and logistics and delivery. We discussed and outlined the risks, benefits, short and long-term effects associated with radiotherapy. We discussed what active surveillance involves, such as prostate biopsies and routine PSA checks.  At the end of the conversation the patient is interested in moving forward with active surveillance. He would like to pursue knee replacement revision surgery and hernia repair. He will continue follow up with [name] and is scheduled for postoperative follow up on 08/11/2018.  If the patient experiences PSA progression or progression on future biopsy, I will look forward to reviewing his options.  We spent 45 minutes face to face with the patient and more than 50% of that time was spent in counseling and/or coordination of care.    ------------------------------------------------   Tyler Pita, MD [place] Director and Director of Stereotactic Radiosurgery Direct Dial: [phone]  Fax: [phone] [email or web address]  Skype  [email or web address]  This document serves as a record of services personally performed by Tyler Pita, MD. It was created on his behalf by Wilburn Mylar, a trained medical scribe. The  creation of this record is based on the  scribe's personal observations and the provider's statements to them. This document has been checked and approved by the attending provider.

## 2018-11-07 ENCOUNTER — Telehealth: Payer: Self-pay | Admitting: Radiation Oncology

## 2018-11-07 NOTE — Telephone Encounter (Signed)
Received voicemail message from patient requesting to move forward with radiation therapy. Noted that at consult on 08/01/2018 he opted to pursue active surveillance due to desire to have knee surgery first. Phoned patient to inquire. No answer and no option to leave a message. I will attempt on Monday to reach this patient again.

## 2018-11-10 ENCOUNTER — Telehealth: Payer: Self-pay | Admitting: Radiation Oncology

## 2018-11-10 ENCOUNTER — Telehealth: Payer: Self-pay | Admitting: Medical Oncology

## 2018-11-10 NOTE — Telephone Encounter (Signed)
Returned call to patient and his wife Sergio Herring, regarding radiation treatment for his prostate cancer. He had planned knee surgery in March but due to COVID-19, the surgery was postponed. He received an injection in his knee and rescheduled  surgery for August. He is concerned about his prostate cancer and postponing treatment. We discussed his PSA and Gleason score, which places him in a favorable risk group to continue active surveillance. I stressed the importance of following up with [name], with PSA checks and repeat biopsies. He has a follow up in about 3 weeks. They voiced understanding of the above and will continue with active surveillance and move forward with knee surgery. I encouraged them to call with questions or concerns. She was thankful for the return call and voiced understanding of the above.

## 2018-11-10 NOTE — Telephone Encounter (Signed)
Second attempt made to reach patient. Received voicemail message on Friday from patient that he wished to start radiation therapy. Phoned patient back promptly but no answer and no option to leave a message. Received message this morning from patient's wife that she was on a long distance call when I called on Friday and couldn't switch over. Today, I left a message with my direct number requesting they phone back.

## 2019-02-16 ENCOUNTER — Telehealth: Payer: Self-pay | Admitting: Radiation Oncology

## 2019-02-16 NOTE — Telephone Encounter (Addendum)
Received call from patient's wife questioning "what to do next" to get her husband going with radiation. She confirms her husband had knee surgery and has healed. She questions if he needs to see [name] first for a repeat PSA. This RN will pass along her questions to the provider and phone back with directions.

## 2019-02-16 NOTE — Telephone Encounter (Signed)
Since we have not seen the patient since 07/2018 and at the time of consult, his decision was to continue active surveillance, he will need a FUN visit with [place], after his f/u visit with his urologist, [name]. Friday 03/13/19 would be ideal so that we have time to get his current PSA lab from [name] and he has a chance to discuss further with his urologist, prior to our visit. Ailene Ards

## 2019-02-16 NOTE — Telephone Encounter (Signed)
Phoned Sergio Herring back to let her know I received her voicemail. She goes onto explain that her husband is scheduled with [name] on 03/09/2019 to have his PSA checked again.

## 2019-03-09 ENCOUNTER — Telehealth: Payer: Self-pay | Admitting: Radiation Oncology

## 2019-03-09 NOTE — Telephone Encounter (Signed)
Received voicemail message from patient requesting return call. Phoned patient back. Patient questions if he has an appointment today with Dr. Tammi Klippel. Explained his appointment is on Friday, October 16th starting at 1230. Patient verbalized understanding and expressed appreciation for the return call.

## 2019-03-10 NOTE — Progress Notes (Signed)
  Radiation Oncology         [phone]) [phone] ________________________________  Name: Sergio Herring. MRN: [id number]  Date: 03/13/2019  DOB: May 30, 1945  SIMULATION AND TREATMENT PLANNING NOTE    ICD-10-CM   1. Cancer of prostate [facility])  C61     DIAGNOSIS:  73 y.o. gentleman with Stage T1c adenocarcinoma of the prostate with Gleason score of 3+3, and PSA of 5.69  NARRATIVE:  The patient was brought to the [place].  Identity was confirmed.  All relevant records and images related to the planned course of therapy were reviewed.  The patient freely provided informed written consent to proceed with treatment after reviewing the details related to the planned course of therapy. The consent form was witnessed and verified by the simulation staff.  Then, the patient was set-up in a stable reproducible supine position for radiation therapy.  A vacuum lock pillow device was custom fabricated to position his legs in a reproducible immobilized position.  Then, I performed a urethrogram under sterile conditions to identify the prostatic apex.  CT images were obtained.  Surface markings were placed.  The CT images were loaded into the planning software.  Then the prostate target and avoidance structures including the rectum, bladder, bowel and hips were contoured.  Treatment planning then occurred.  The radiation prescription was entered and confirmed.  A total of one complex treatment devices was fabricated. I have requested : Intensity Modulated Radiotherapy (IMRT) is medically necessary for this case for the following reason:  Rectal sparing.[name]  PLAN:  The patient will receive 70 Gy in 28 fractions.  ________________________________  Sheral Apley Tammi Klippel, M.D.

## 2019-03-11 ENCOUNTER — Telehealth: Payer: Self-pay | Admitting: *Deleted

## 2019-03-11 NOTE — Telephone Encounter (Signed)
CALLED PATIENT TO REMIND TO COME FOR 03-13-19 [facility] APPT.- ARRIVAL TIME- 12:45 PM @ [place], SPOKE WITH PATIENT'S WIFE, JANET AND SHE IS AWARE OF THIS APPT.

## 2019-03-11 NOTE — Telephone Encounter (Signed)
XXXX 

## 2019-03-12 ENCOUNTER — Telehealth: Payer: Self-pay | Admitting: Radiation Oncology

## 2019-03-12 NOTE — Progress Notes (Signed)
GU Location of Tumor / Histology: prostatic adenocarcinoma  If Prostate Cancer, Gleason Score is (3 + 3) and PSA is (5.69). Prostate volume: 82.5 mL.  Sergio Herring. with hx of elevated PSA, BPH with obstruction and ED.   09/2017             PSA                 3.97 09/2016             PSA                 3.69  REPEAT PSA WITH [name] ON 03/09/2019: 2.83  Biopsies of prostate (if applicable) revealed:  ACCESSION NUMBER: [id number] RECEIVED: 06/26/2018 ORDERING PHYSICIAN: BRADLEY Loma Newton , MD PATIENT NAME: [name], [name] REPORT  FINAL PATHOLOGIC DIAGNOSIS A. PROSTATE, RIGHT APEX, NEEDLE BIOPSY: BENIGN PROSTATIC TISSUE.  B. 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 TUMOR INVOLVES ONE OF ONE CORE AND OCCUPIES APPROXIMATELY 5% OF THE TISSUE SAMPLE.  C. 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 TUMOR INVOLVES ONE OF ONE CORE AND OCCUPIES APPROXIMATELY 5-10% OF THE TISSUE SAMPLE.  D. PROSTATE, RIGHT LATERAL APEX, NEEDLE BIOPSY: BENIGN PROSTATIC TISSUE.  E. PROSTATE, RIGHT LATERAL MID, NEEDLE BIOPSY: BENIGN PROSTATIC TISSUE.  F. PROSTATE, RIGHT LATERAL BASE, NEEDLE BIOPSY: BENIGN PROSTATIC TISSUE.  G. PROSTATE, LEFT APEX, NEEDLE BIOPSY: BENIGN PROSTATIC TISSUE.  H. PROSTATE, LEFT MID, NEEDLE BIOPSY: BENIGN PROSTATIC TISSUE.  I.  PROSTATE, LEFT BASE, NEEDLE BIOPSY: BENIGN PROSTATIC TISSUE.  J. PROSTATE, LEFT LATERAL APEX, NEEDLE BIOPSY: BENIGN PROSTATIC TISSUE.  K. PROSTATE, LEFT LATERAL MID, NEEDLE BIOPSY: BENIGN PROSTATIC TISSUE.  L. 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 TUMOR INVOLVES ONE OF ONE CORE AND OCCUPIES APPROXIMATELY 40% OF THE TISSUE  SAMPLE.  Past/Anticipated interventions by urology, if [id number], [place],  cystoscopy, TURP, referral for consideration of radiation therapy.  Past/Anticipated interventions by medical oncology, if any: no  Weight changes, if any: Denies  Bowel/Bladder complaints, if any: IPSS 3. SHIM 2. Reports dysuria since TURP is slowly improving. Denies dysuria or hematuria.Denies urinary leakage or incontinence. Reports urinary frequency and urgency.   Nausea/Vomiting, if any: no  Pain issues, if any:  Reports a hx of back surgeries. Reports recently his lower back has "really been killing him."  SAFETY ISSUES:  Prior radiation? "took a pill for thyroid cancer." also had colon ca but it was excised with no further tx.  Pacemaker/ICD? no  Possible current pregnancy? no, male patient   Is the patient on methotrexate? no  Current Complaints / other details:  73 year old male. Father with hx of prostate cancer. Initially patient opted to forego radiation therapy. Since that time patient has had right knee surgery and recovered well. He wishes to purse radiation therapy now. Patient plan to have his inguinal hernia repair following radiation therapy.

## 2019-03-13 ENCOUNTER — Other Ambulatory Visit: Payer: Self-pay

## 2019-03-13 ENCOUNTER — Ambulatory Visit
Admission: RE | Admit: 2019-03-13 | Discharge: 2019-03-13 | Disposition: A | Discharge status: Home / Self Care | Payer: MEDICARE | Source: Ambulatory Visit | Attending: Radiation Oncology | Admitting: Radiation Oncology

## 2019-03-13 ENCOUNTER — Encounter: Payer: Self-pay | Admitting: Radiation Oncology

## 2019-03-13 ENCOUNTER — Encounter: Payer: Self-pay | Admitting: Medical Oncology

## 2019-03-13 DIAGNOSIS — I10 Essential (primary) hypertension: Secondary | ICD-10-CM | POA: Insufficient documentation

## 2019-03-13 DIAGNOSIS — C61 Malignant neoplasm of prostate: Secondary | ICD-10-CM | POA: Diagnosis present

## 2019-03-13 DIAGNOSIS — D649 Anemia, unspecified: Secondary | ICD-10-CM | POA: Diagnosis not present

## 2019-03-13 DIAGNOSIS — Z510 Encounter for antineoplastic radiation therapy: Secondary | ICD-10-CM | POA: Insufficient documentation

## 2019-03-13 DIAGNOSIS — E785 Hyperlipidemia, unspecified: Secondary | ICD-10-CM | POA: Diagnosis not present

## 2019-03-13 DIAGNOSIS — Z85038 Personal history of other malignant neoplasm of large intestine: Secondary | ICD-10-CM | POA: Diagnosis not present

## 2019-03-13 DIAGNOSIS — F17210 Nicotine dependence, cigarettes, uncomplicated: Secondary | ICD-10-CM | POA: Insufficient documentation

## 2019-03-13 DIAGNOSIS — C73 Malignant neoplasm of thyroid gland: Secondary | ICD-10-CM | POA: Diagnosis not present

## 2019-03-13 DIAGNOSIS — Z79899 Other long term (current) drug therapy: Secondary | ICD-10-CM | POA: Insufficient documentation

## 2019-03-13 NOTE — Progress Notes (Signed)
See progress note under physician encounter. 

## 2019-03-13 NOTE — Progress Notes (Signed)
Radiation Oncology         [phone]) [phone] ________________________________  Outpatient Re-Evaluation   Name: Sergio Herring. MRN: [id number]  Date: 03/13/2019  DOB: Sep 02, 1945  CC:No primary care provider on file.  [name], [name], *   REFERRING PHYSICIAN: [name], *  DIAGNOSIS: 73 y.o. gentleman with Stage T1c adenocarcinoma of the prostate with Gleason score of 3+3, and PSA of 5.69.    ICD-10-CM   1. Cancer of prostate [facility])  C61     HISTORY OF PRESENT ILLNESS: Sergio Herring. is a 73 y.o. male with a diagnosis of prostate cancer. He was seen in consultation on 08/01/2018. At that time, he opted to pursue active surveillance, in part due to impending knee surgery. In summary, this patient was noted to have an elevated PSA of 5.69 by [name] at [facility]. He underwent prostate biopsy on 06/26/2018, which showed 3 cores positive with Gleason 3+3 disease and a prostate volume of 72 cc. In light of his prior colon cancer surgery, [name] felt he was not a good candidate for prostatectomy. They proceeded with TURP on 07/28/2018, which showed BPH with patchy inflammation on pathology report.  Since that time, he underwent knee surgery on 12/31/2018 and is now ready to pursue treatment for his prostate cancer. We referred him back to [name] on 03/09/2019 for evaluation and repeat PSA prior to beginning treatment. Digital rectal exam performed at that time was again normal, and PSA is down to 2.83. He reported continued improvement to his LUTS.  The patient has kindly been referred back to [place] today for discussion of potential radiation treatment options.  Of note, he has a personal of thyroid cancer as well as colon cancer.   PREVIOUS RADIATION THERAPY: No  PAST MEDICAL HISTORY:  Past Medical History:  Diagnosis Date  . Anemia   . Back pain   . Cancer ([place])    thyroid  . Colon cancer ([place])   . Hyperlipidemia   . Hypertension   . Prostate cancer ([place])     PAST SURGICAL HISTORY: Past Surgical History:  Procedure Laterality Date  . CATARACT EXTRACTION, BILATERAL    . COLON SURGERY  2013  . CYSTOSCOPY    . THYROIDECTOMY  07/04/2011   [facility]  . TRANSURETHRAL RESECTION OF PROSTATE      FAMILY HISTORY:  Family History  Problem Relation Age of Onset  . Emphysema Mother   . Prostate cancer Father   . Aneurysm Daughter 23       ruptured cerebral aneurysm  . Breast cancer Neg Hx   . Colon cancer Neg Hx     SOCIAL HISTORY:  Social History   Socioeconomic History  . Marital status: Married    Spouse name: Not on file  . Number of children: Not on file  . Years of education: Not on file  . Highest education level: Not on file  Occupational History  . Not on file  Social Needs  . Financial resource strain: Not on file  . Food insecurity    Worry: Not on file    Inability: Not on file  . Transportation needs    Medical: Not on file    Non-medical: Not on file  Tobacco Use  . Smoking status: Current Every Day Smoker    Packs/day: 0.25    Years: 59.00    Pack years: 14.75    Types: Cigarettes  . Smokeless tobacco: Never Used  Substance  and Sexual Activity  . Alcohol use: No  . Drug use: No  . Sexual activity: Not Currently  Lifestyle  . Physical activity    Days per week: Not on file    Minutes per session: Not on file  . Stress: Not on file  Relationships  . Social Herbalist on phone: Not on file    Gets together: Not on file    Attends religious service: Not on file    Active member of club or organization: Not on file    Attends meetings of clubs or organizations: Not on file    Relationship status: Not on file  . Intimate partner violence    Fear of current or ex partner: Not on file    Emotionally abused: Not on file    Physically abused: Not on file    Forced sexual activity: Not on file  Other Topics Concern  . Not on file  Social History Narrative  . Not on file     ALLERGIES: Patient has no known allergies.  MEDICATIONS:  Current Outpatient Medications  Medication Sig Dispense Refill  . buPROPion (WELLBUTRIN XL) 150 MG 24 hr tablet TAKE 1 TABLET BY MOUTH EVERY DAY    . Fe Cbn-Fe Gluc-FA-B12-C-DSS (FERRALET 90) 90-1 MG TABS Take 1 tablet by mouth daily. 90 each 3  . ipratropium (ATROVENT) 0.03 % nasal spray Place into the nose.    . levothyroxine (SYNTHROID, LEVOTHROID) 200 MCG tablet TAKE 1 TABLET BY MOUTH DAILY AT 6AM    . pravastatin (PRAVACHOL) 20 MG tablet Take 1 tablet (20 mg total) by mouth at bedtime. 90 tablet 3  . ramipril (ALTACE) 10 MG capsule Take 10 mg by mouth daily.    . sertraline (ZOLOFT) 50 MG tablet Take by mouth.    . sildenafil (VIAGRA) 100 MG tablet Take 1 tablet by mouth 30 minutes prior to intercourse     No current facility-administered medications for this encounter.     REVIEW OF SYSTEMS:  On review of systems, the patient reports that he is doing well overall. He denies any chest pain, shortness of breath, cough, fevers, chills, night sweats, unintended weight changes. He denies any bowel disturbances, and denies abdominal pain, nausea or vomiting. He reports recent worsening lower back pain, noting a history of back surgeries. His IPSS score today is 3, indicating mild urinary symptoms. He reports improvement to this symptoms since TURP. His SHIM was 2, indicating he has severe erectile dysfunction. Per [name] note, patient reports it has been getting worse. A complete review of systems is obtained and is otherwise negative.    PHYSICAL EXAM:  Wt Readings from Last 3 Encounters:  03/13/19 203 lb 3.2 oz (92.2 kg)  08/01/18 207 lb 8 oz (94.1 kg)  09/29/13 207 lb 8 oz (94.1 kg)   Temp Readings from Last 3 Encounters:  03/13/19 97.8 F (36.6 C) (Oral)  08/01/18 98.6 F (37 C) (Oral)  09/29/13 98.1 F (36.7 C) (Oral)   BP Readings from Last 3 Encounters:  03/13/19 (!) 125/99  08/01/18 129/67  09/29/13  (!) 142/80   Pulse Readings from Last 3 Encounters:  03/13/19 72  08/01/18 72  09/29/13 71   Pain Assessment Pain Score: 0-No pain/10  In general this is a well appearing Caucasian gentleman in no acute distress. He is alert and oriented x4 and appropriate throughout the examination.    KPS = 100  100 - Normal; no complaints; no evidence  of disease. 90   - Able to carry on normal activity; minor signs or symptoms of disease. 80   - Normal activity with effort; some signs or symptoms of disease. 56   - Cares for self; unable to carry on normal activity or to do active work. 60   - Requires occasional assistance, but is able to care for most of his personal needs. 50   - Requires considerable assistance and frequent medical care. 13   - Disabled; requires special care and assistance. 40   - Severely disabled; hospital admission is indicated although death not imminent. 70   - Very sick; hospital admission necessary; active supportive treatment necessary. 10   - Moribund; fatal processes progressing rapidly. 0     - Dead  Karnofsky DA, Abelmann [place], Craver LS and Burchenal [facility] [phone]) The use of the nitrogen mustards in the palliative treatment of carcinoma: with particular reference to bronchogenic carcinoma Cancer 1 [phone]  LABORATORY DATA:  Lab Results  Component Value Date   WBC 10.0 10/12/2013   HGB 11.9 (L) 10/12/2013   HCT 36.0 (L) 10/12/2013   MCV 87.6 10/12/2013   PLT 345.0 10/12/2013   Lab Results  Component Value Date   NA 141 09/29/2013   K 4.7 09/29/2013   CL 104 09/29/2013   CO2 30 09/29/2013   Lab Results  Component Value Date   ALT 26 09/23/2013   AST 22 09/23/2013   ALKPHOS 67 09/23/2013   BILITOT <0.2 (L) 09/23/2013     RADIOGRAPHY: No results found.    IMPRESSION/PLAN: 1. 73 y.o. gentleman with Stage T1c adenocarcinoma of the prostate with Gleason Score of 3+3, and PSA of 5.69. Today I reviewed the findings and workup thus far.  We discussed the  natural history of prostate cancer.  We reviewed the the implications of T-stage, Gleason's Score, and PSA on decision-making and outcomes in prostate cancer.  We discussed radiation treatment in the management of prostate cancer with regard to the logistics and delivery of external beam radiation treatment as well as the logistics and delivery of prostate brachytherapy.  We compared and contrasted each of these approaches and also compared these against prostatectomy.  The patient expressed interest in external beam radiotherapy.  I filled out a patient counseling form for him with relevant treatment diagrams and we retained a copy for our records.   The patient would like to proceed with prostate IMRT. We will proceed with CT simulation today.[name]      ------------------------------------------------   Tyler Pita, MD [place] Director and Director of Stereotactic Radiosurgery Direct Dial: [phone]  Fax: [phone] [email or web address]  Skype  [email or web address]  This document serves as a record of services personally performed by Tyler Pita, MD. It was created on his behalf by Wilburn Mylar, a trained medical scribe. The creation of this record is based on the scribe's personal observations and the provider's statements to them. This document has been checked and approved by the attending provider.

## 2019-03-20 DIAGNOSIS — Z510 Encounter for antineoplastic radiation therapy: Secondary | ICD-10-CM | POA: Diagnosis not present

## 2019-03-24 ENCOUNTER — Encounter: Payer: Self-pay | Admitting: Medical Oncology

## 2019-03-24 ENCOUNTER — Ambulatory Visit
Admission: RE | Admit: 2019-03-24 | Discharge: 2019-03-24 | Disposition: A | Discharge status: Home / Self Care | Payer: MEDICARE | Source: Ambulatory Visit | Attending: Radiation Oncology | Admitting: Radiation Oncology

## 2019-03-24 ENCOUNTER — Other Ambulatory Visit: Payer: Self-pay

## 2019-03-24 DIAGNOSIS — Z510 Encounter for antineoplastic radiation therapy: Secondary | ICD-10-CM | POA: Diagnosis not present

## 2019-03-25 ENCOUNTER — Ambulatory Visit
Admission: RE | Admit: 2019-03-25 | Discharge: 2019-03-25 | Disposition: A | Discharge status: Home / Self Care | Payer: MEDICARE | Source: Ambulatory Visit | Attending: Radiation Oncology | Admitting: Radiation Oncology

## 2019-03-25 ENCOUNTER — Other Ambulatory Visit: Payer: Self-pay

## 2019-03-25 DIAGNOSIS — Z510 Encounter for antineoplastic radiation therapy: Secondary | ICD-10-CM | POA: Diagnosis not present

## 2019-03-26 ENCOUNTER — Other Ambulatory Visit: Payer: Self-pay

## 2019-03-26 ENCOUNTER — Ambulatory Visit
Admission: RE | Admit: 2019-03-26 | Discharge: 2019-03-26 | Disposition: A | Discharge status: Home / Self Care | Payer: MEDICARE | Source: Ambulatory Visit | Attending: Radiation Oncology | Admitting: Radiation Oncology

## 2019-03-26 DIAGNOSIS — Z510 Encounter for antineoplastic radiation therapy: Secondary | ICD-10-CM | POA: Diagnosis not present

## 2019-03-27 ENCOUNTER — Other Ambulatory Visit: Payer: Self-pay

## 2019-03-27 ENCOUNTER — Ambulatory Visit
Admission: RE | Admit: 2019-03-27 | Discharge: 2019-03-27 | Disposition: A | Discharge status: Home / Self Care | Payer: MEDICARE | Source: Ambulatory Visit | Attending: Radiation Oncology | Admitting: Radiation Oncology

## 2019-03-27 DIAGNOSIS — Z510 Encounter for antineoplastic radiation therapy: Secondary | ICD-10-CM | POA: Diagnosis not present

## 2019-03-30 ENCOUNTER — Ambulatory Visit
Admission: RE | Admit: 2019-03-30 | Discharge: 2019-03-30 | Disposition: A | Discharge status: Home / Self Care | Payer: MEDICARE | Source: Ambulatory Visit | Attending: Radiation Oncology | Admitting: Radiation Oncology

## 2019-03-30 ENCOUNTER — Other Ambulatory Visit: Payer: Self-pay

## 2019-03-30 DIAGNOSIS — Z510 Encounter for antineoplastic radiation therapy: Secondary | ICD-10-CM | POA: Diagnosis not present

## 2019-03-30 DIAGNOSIS — C61 Malignant neoplasm of prostate: Secondary | ICD-10-CM | POA: Insufficient documentation

## 2019-03-31 ENCOUNTER — Ambulatory Visit
Admission: RE | Admit: 2019-03-31 | Discharge: 2019-03-31 | Disposition: A | Discharge status: Home / Self Care | Payer: MEDICARE | Source: Ambulatory Visit | Attending: Radiation Oncology | Admitting: Radiation Oncology

## 2019-03-31 ENCOUNTER — Other Ambulatory Visit: Payer: Self-pay

## 2019-03-31 DIAGNOSIS — Z510 Encounter for antineoplastic radiation therapy: Secondary | ICD-10-CM | POA: Diagnosis not present

## 2019-04-01 ENCOUNTER — Other Ambulatory Visit: Payer: Self-pay

## 2019-04-01 ENCOUNTER — Ambulatory Visit
Admission: RE | Admit: 2019-04-01 | Discharge: 2019-04-01 | Disposition: A | Discharge status: Home / Self Care | Payer: MEDICARE | Source: Ambulatory Visit | Attending: Radiation Oncology | Admitting: Radiation Oncology

## 2019-04-01 DIAGNOSIS — Z510 Encounter for antineoplastic radiation therapy: Secondary | ICD-10-CM | POA: Diagnosis not present

## 2019-04-02 ENCOUNTER — Other Ambulatory Visit: Payer: Self-pay

## 2019-04-02 ENCOUNTER — Ambulatory Visit
Admission: RE | Admit: 2019-04-02 | Discharge: 2019-04-02 | Disposition: A | Discharge status: Home / Self Care | Payer: MEDICARE | Source: Ambulatory Visit | Attending: Radiation Oncology | Admitting: Radiation Oncology

## 2019-04-02 DIAGNOSIS — Z510 Encounter for antineoplastic radiation therapy: Secondary | ICD-10-CM | POA: Diagnosis not present

## 2019-04-03 ENCOUNTER — Other Ambulatory Visit: Payer: Self-pay

## 2019-04-03 ENCOUNTER — Ambulatory Visit
Admission: RE | Admit: 2019-04-03 | Discharge: 2019-04-03 | Disposition: A | Discharge status: Home / Self Care | Payer: MEDICARE | Source: Ambulatory Visit | Attending: Radiation Oncology | Admitting: Radiation Oncology

## 2019-04-03 DIAGNOSIS — Z510 Encounter for antineoplastic radiation therapy: Secondary | ICD-10-CM | POA: Diagnosis not present

## 2019-04-06 ENCOUNTER — Other Ambulatory Visit: Payer: Self-pay

## 2019-04-06 ENCOUNTER — Ambulatory Visit
Admission: RE | Admit: 2019-04-06 | Discharge: 2019-04-06 | Disposition: A | Discharge status: Home / Self Care | Payer: MEDICARE | Source: Ambulatory Visit | Attending: Radiation Oncology | Admitting: Radiation Oncology

## 2019-04-06 DIAGNOSIS — Z510 Encounter for antineoplastic radiation therapy: Secondary | ICD-10-CM | POA: Diagnosis not present

## 2019-04-07 ENCOUNTER — Ambulatory Visit
Admission: RE | Admit: 2019-04-07 | Discharge: 2019-04-07 | Disposition: A | Discharge status: Home / Self Care | Payer: MEDICARE | Source: Ambulatory Visit | Attending: Radiation Oncology | Admitting: Radiation Oncology

## 2019-04-07 ENCOUNTER — Other Ambulatory Visit: Payer: Self-pay

## 2019-04-07 DIAGNOSIS — Z510 Encounter for antineoplastic radiation therapy: Secondary | ICD-10-CM | POA: Diagnosis not present

## 2019-04-08 ENCOUNTER — Ambulatory Visit
Admission: RE | Admit: 2019-04-08 | Discharge: 2019-04-08 | Disposition: A | Discharge status: Home / Self Care | Payer: MEDICARE | Source: Ambulatory Visit | Attending: Radiation Oncology | Admitting: Radiation Oncology

## 2019-04-08 ENCOUNTER — Other Ambulatory Visit: Payer: Self-pay

## 2019-04-08 DIAGNOSIS — Z510 Encounter for antineoplastic radiation therapy: Secondary | ICD-10-CM | POA: Diagnosis not present

## 2019-04-09 ENCOUNTER — Ambulatory Visit
Admission: RE | Admit: 2019-04-09 | Discharge: 2019-04-09 | Disposition: A | Discharge status: Home / Self Care | Payer: MEDICARE | Source: Ambulatory Visit | Attending: Radiation Oncology | Admitting: Radiation Oncology

## 2019-04-09 ENCOUNTER — Other Ambulatory Visit: Payer: Self-pay

## 2019-04-09 DIAGNOSIS — Z510 Encounter for antineoplastic radiation therapy: Secondary | ICD-10-CM | POA: Diagnosis not present

## 2019-04-10 ENCOUNTER — Other Ambulatory Visit: Payer: Self-pay

## 2019-04-10 ENCOUNTER — Ambulatory Visit
Admission: RE | Admit: 2019-04-10 | Discharge: 2019-04-10 | Disposition: A | Discharge status: Home / Self Care | Payer: MEDICARE | Source: Ambulatory Visit | Attending: Radiation Oncology | Admitting: Radiation Oncology

## 2019-04-10 DIAGNOSIS — Z510 Encounter for antineoplastic radiation therapy: Secondary | ICD-10-CM | POA: Diagnosis not present

## 2019-04-13 ENCOUNTER — Other Ambulatory Visit: Payer: Self-pay

## 2019-04-13 ENCOUNTER — Ambulatory Visit
Admission: RE | Admit: 2019-04-13 | Discharge: 2019-04-13 | Disposition: A | Discharge status: Home / Self Care | Payer: MEDICARE | Source: Ambulatory Visit | Attending: Radiation Oncology | Admitting: Radiation Oncology

## 2019-04-13 DIAGNOSIS — Z510 Encounter for antineoplastic radiation therapy: Secondary | ICD-10-CM | POA: Diagnosis not present

## 2019-04-14 ENCOUNTER — Other Ambulatory Visit: Payer: Self-pay

## 2019-04-14 ENCOUNTER — Ambulatory Visit
Admission: RE | Admit: 2019-04-14 | Discharge: 2019-04-14 | Disposition: A | Discharge status: Home / Self Care | Payer: MEDICARE | Source: Ambulatory Visit | Attending: Radiation Oncology | Admitting: Radiation Oncology

## 2019-04-14 DIAGNOSIS — Z510 Encounter for antineoplastic radiation therapy: Secondary | ICD-10-CM | POA: Diagnosis not present

## 2019-04-15 ENCOUNTER — Ambulatory Visit
Admission: RE | Admit: 2019-04-15 | Discharge: 2019-04-15 | Disposition: A | Discharge status: Home / Self Care | Payer: MEDICARE | Source: Ambulatory Visit | Attending: Radiation Oncology | Admitting: Radiation Oncology

## 2019-04-15 ENCOUNTER — Other Ambulatory Visit: Payer: Self-pay

## 2019-04-15 DIAGNOSIS — Z510 Encounter for antineoplastic radiation therapy: Secondary | ICD-10-CM | POA: Diagnosis not present

## 2019-04-16 ENCOUNTER — Other Ambulatory Visit: Payer: Self-pay

## 2019-04-16 ENCOUNTER — Ambulatory Visit
Admission: RE | Admit: 2019-04-16 | Discharge: 2019-04-16 | Disposition: A | Discharge status: Home / Self Care | Payer: MEDICARE | Source: Ambulatory Visit | Attending: Radiation Oncology | Admitting: Radiation Oncology

## 2019-04-16 DIAGNOSIS — Z510 Encounter for antineoplastic radiation therapy: Secondary | ICD-10-CM | POA: Diagnosis not present

## 2019-04-17 ENCOUNTER — Ambulatory Visit
Admission: RE | Admit: 2019-04-17 | Discharge: 2019-04-17 | Disposition: A | Discharge status: Home / Self Care | Payer: MEDICARE | Source: Ambulatory Visit | Attending: Radiation Oncology | Admitting: Radiation Oncology

## 2019-04-17 ENCOUNTER — Other Ambulatory Visit: Payer: Self-pay

## 2019-04-17 DIAGNOSIS — Z510 Encounter for antineoplastic radiation therapy: Secondary | ICD-10-CM | POA: Diagnosis not present

## 2019-04-19 ENCOUNTER — Other Ambulatory Visit: Payer: Self-pay

## 2019-04-19 ENCOUNTER — Ambulatory Visit
Admission: RE | Admit: 2019-04-19 | Discharge: 2019-04-19 | Disposition: A | Discharge status: Home / Self Care | Payer: MEDICARE | Source: Ambulatory Visit | Attending: Radiation Oncology | Admitting: Radiation Oncology

## 2019-04-19 DIAGNOSIS — Z510 Encounter for antineoplastic radiation therapy: Secondary | ICD-10-CM | POA: Diagnosis not present

## 2019-04-20 ENCOUNTER — Ambulatory Visit
Admission: RE | Admit: 2019-04-20 | Discharge: 2019-04-20 | Disposition: A | Discharge status: Home / Self Care | Payer: MEDICARE | Source: Ambulatory Visit | Attending: Radiation Oncology | Admitting: Radiation Oncology

## 2019-04-20 ENCOUNTER — Other Ambulatory Visit: Payer: Self-pay

## 2019-04-20 DIAGNOSIS — Z510 Encounter for antineoplastic radiation therapy: Secondary | ICD-10-CM | POA: Diagnosis not present

## 2019-04-21 ENCOUNTER — Ambulatory Visit
Admission: RE | Admit: 2019-04-21 | Discharge: 2019-04-21 | Disposition: A | Discharge status: Home / Self Care | Payer: MEDICARE | Source: Ambulatory Visit | Attending: Radiation Oncology | Admitting: Radiation Oncology

## 2019-04-21 ENCOUNTER — Other Ambulatory Visit: Payer: Self-pay

## 2019-04-21 DIAGNOSIS — Z510 Encounter for antineoplastic radiation therapy: Secondary | ICD-10-CM | POA: Diagnosis not present

## 2019-04-22 ENCOUNTER — Ambulatory Visit
Admission: RE | Admit: 2019-04-22 | Discharge: 2019-04-22 | Disposition: A | Discharge status: Home / Self Care | Payer: MEDICARE | Source: Ambulatory Visit | Attending: Radiation Oncology | Admitting: Radiation Oncology

## 2019-04-22 ENCOUNTER — Other Ambulatory Visit: Payer: Self-pay

## 2019-04-22 DIAGNOSIS — Z510 Encounter for antineoplastic radiation therapy: Secondary | ICD-10-CM | POA: Diagnosis not present

## 2019-04-27 ENCOUNTER — Ambulatory Visit: Payer: MEDICARE

## 2019-04-27 ENCOUNTER — Ambulatory Visit
Admission: RE | Admit: 2019-04-27 | Discharge: 2019-04-27 | Disposition: A | Discharge status: Home / Self Care | Payer: MEDICARE | Source: Ambulatory Visit | Attending: Radiation Oncology | Admitting: Radiation Oncology

## 2019-04-27 ENCOUNTER — Other Ambulatory Visit: Payer: Self-pay

## 2019-04-27 DIAGNOSIS — Z510 Encounter for antineoplastic radiation therapy: Secondary | ICD-10-CM | POA: Diagnosis not present

## 2019-04-28 ENCOUNTER — Ambulatory Visit
Admission: RE | Admit: 2019-04-28 | Discharge: 2019-04-28 | Disposition: A | Discharge status: Home / Self Care | Payer: MEDICARE | Source: Ambulatory Visit | Attending: Radiation Oncology | Admitting: Radiation Oncology

## 2019-04-28 ENCOUNTER — Ambulatory Visit: Payer: MEDICARE

## 2019-04-28 ENCOUNTER — Other Ambulatory Visit: Payer: Self-pay

## 2019-04-28 DIAGNOSIS — Z510 Encounter for antineoplastic radiation therapy: Secondary | ICD-10-CM | POA: Diagnosis not present

## 2019-04-28 DIAGNOSIS — C61 Malignant neoplasm of prostate: Secondary | ICD-10-CM | POA: Diagnosis not present

## 2019-04-29 ENCOUNTER — Ambulatory Visit
Admission: RE | Admit: 2019-04-29 | Discharge: 2019-04-29 | Disposition: A | Discharge status: Home / Self Care | Payer: MEDICARE | Source: Ambulatory Visit | Attending: Radiation Oncology | Admitting: Radiation Oncology

## 2019-04-29 ENCOUNTER — Other Ambulatory Visit: Payer: Self-pay

## 2019-04-29 DIAGNOSIS — Z510 Encounter for antineoplastic radiation therapy: Secondary | ICD-10-CM | POA: Diagnosis not present

## 2019-04-30 ENCOUNTER — Ambulatory Visit
Admission: RE | Admit: 2019-04-30 | Discharge: 2019-04-30 | Disposition: A | Discharge status: Home / Self Care | Payer: MEDICARE | Source: Ambulatory Visit | Attending: Radiation Oncology | Admitting: Radiation Oncology

## 2019-04-30 ENCOUNTER — Other Ambulatory Visit: Payer: Self-pay

## 2019-04-30 DIAGNOSIS — Z510 Encounter for antineoplastic radiation therapy: Secondary | ICD-10-CM | POA: Diagnosis not present

## 2019-05-01 ENCOUNTER — Encounter: Payer: Self-pay | Admitting: Radiation Oncology

## 2019-05-01 ENCOUNTER — Ambulatory Visit
Admission: RE | Admit: 2019-05-01 | Discharge: 2019-05-01 | Disposition: A | Discharge status: Home / Self Care | Payer: MEDICARE | Source: Ambulatory Visit | Attending: Radiation Oncology | Admitting: Radiation Oncology

## 2019-05-01 ENCOUNTER — Other Ambulatory Visit: Payer: Self-pay

## 2019-05-01 ENCOUNTER — Ambulatory Visit: Payer: MEDICARE

## 2019-05-01 DIAGNOSIS — Z510 Encounter for antineoplastic radiation therapy: Secondary | ICD-10-CM | POA: Diagnosis not present

## 2019-05-04 ENCOUNTER — Ambulatory Visit: Payer: MEDICARE

## 2019-05-11 ENCOUNTER — Other Ambulatory Visit: Payer: Self-pay | Admitting: Orthopedic Surgery

## 2019-05-11 DIAGNOSIS — M4716 Other spondylosis with myelopathy, lumbar region: Secondary | ICD-10-CM

## 2019-05-14 ENCOUNTER — Telehealth: Payer: Self-pay | Admitting: Radiation Oncology

## 2019-05-14 NOTE — Telephone Encounter (Signed)
Received voicemail message from patient requesting return call. Phoned patient back promptly.   Noted patient completed IMRT to prostate on 05/01/2019.  Patient reports episodes of diarrhea 10-15 in the last two days. Explained that radiation could have cause this in addition to high fiber or fatty foods. Patient denies seeing blood in his stool. Instructed patient to begin a BRAT diet and Imodium OTC. Also, encouraged patient to use baby wipes with aloe or TUCKS wipes in order to avoid rectal irritation. Instructed patient to phone this RN back if symptoms haven't resolved by Monday.   Patient verbalized understanding of all reviewed and expressed appreciation for the call.

## 2019-05-17 NOTE — Progress Notes (Signed)
  Radiation Oncology         [phone] ________________________________  Name: Sergio Herring. MRN: [id number]  Date: 05/01/2019  DOB: 02/13/1946  End of Treatment Note  Diagnosis:   73 y.o. gentleman with Stage T1c adenocarcinoma of the prostate with Gleason score of 3+3, and PSA of 5.69     Indication for treatment:  Curative, Definitive Radiotherapy       Radiation treatment dates:   03/24/19-05/01/19  Site/dose:   The prostate was treated to 70 Gy in 28 fractions of 2.5 Gy  Beams/energy:   The patient was treated with IMRT using volumetric arc therapy delivering 6 MV X-rays to clockwise and counterclockwise circumferential arcs with a 90 degree collimator offset to avoid dose scalloping.  Image guidance was performed with daily cone beam CT prior to each fraction to align to gold markers in the prostate and assure proper bladder and rectal fill volumes.  Immobilization was achieved with BodyFix custom mold.  Narrative: The patient tolerated radiation treatment relatively well.   The patient experienced some minor urinary irritation and modest fatigue.    Plan: The patient has completed radiation treatment. He will return to radiation oncology clinic for routine followup in one month. I advised him to call or return sooner if he has any questions or concerns related to his recovery or treatment. ________________________________  Sheral Apley. Tammi Klippel, M.D.

## 2019-06-06 ENCOUNTER — Other Ambulatory Visit: Payer: Self-pay

## 2019-06-06 ENCOUNTER — Ambulatory Visit
Admission: RE | Admit: 2019-06-06 | Discharge: 2019-06-06 | Disposition: A | Discharge status: Home / Self Care | Payer: MEDICARE | Source: Ambulatory Visit | Attending: Orthopedic Surgery | Admitting: Orthopedic Surgery

## 2019-06-06 DIAGNOSIS — M4716 Other spondylosis with myelopathy, lumbar region: Secondary | ICD-10-CM

## 2019-06-09 ENCOUNTER — Telehealth: Payer: Self-pay

## 2019-06-09 NOTE — Telephone Encounter (Signed)
Received email from Sam stating patient confused about up coming appointment on Thursday spoke with patient and informed him his appointment will be a telephone conference with provider he verbalized he understood. Called him on [phone] best number to contact him

## 2019-06-11 ENCOUNTER — Other Ambulatory Visit: Payer: Self-pay

## 2019-06-11 ENCOUNTER — Ambulatory Visit
Admission: RE | Admit: 2019-06-11 | Discharge: 2019-06-11 | Disposition: A | Discharge status: Home / Self Care | Payer: MEDICARE | Source: Ambulatory Visit | Attending: Urology | Admitting: Urology

## 2019-06-11 DIAGNOSIS — C61 Malignant neoplasm of prostate: Secondary | ICD-10-CM

## 2019-06-11 NOTE — Progress Notes (Signed)
Radiation Oncology         [phone]) [phone] ________________________________  Name: Sergio Herring. MRN: [id number]  Date: 06/11/2019  DOB: Oct 07, 1945  Post Treatment Note  CC: No primary care provider on file.  Herring, Sergio Leader., *  Diagnosis:   74 y.o. gentleman with Stage T1c adenocarcinoma of the prostate with Gleason score of 3+3, and PSA of 5.69   Interval Since Last Radiation:  6 weeks  03/24/19-05/01/19:  The prostate was treated to 70 Gy in 28 fractions of 2.5 Gy  Narrative:  I spoke with the patient to conduct his routine scheduled 1 month follow up visit via telephone to spare the patient unnecessary potential exposure in the healthcare setting during the current COVID-19 pandemic.  The patient was notified in advance and gave permission to proceed with this visit format. He tolerated radiation treatment relatively well.   The patient experienced some minor urinary irritation with mild occasional dysuria at end of stream, urgency, frequency, weak stream, intermittency, hesitancy and nocturia x3/night as well as modest fatigue.                                On review of systems, the patient states that he is doing very well in general.  He reports gradual improvement in his LUTS but continues with increased frequency, urgency, intermittency and mild dysuria at the end of his stream.  He specifically denies gross hematuria, straining to void, weak stream, incomplete bladder emptying or incontinence.  He reports nocturia 1-2 times per night which is pretty much back to his baseline at this point.  He also reports that his energy level is gradually improving as well.  He denies abdominal pain, nausea, vomiting, diarrhea or constipation.  He reports a healthy appetite and is maintaining his weight.  Overall, he is quite pleased with his progress to date.  ALLERGIES:  has No Known Allergies.  Meds: Current Outpatient Medications  Medication Sig Dispense Refill  . buPROPion  (WELLBUTRIN XL) 150 MG 24 hr tablet TAKE 1 TABLET BY MOUTH EVERY DAY    . buPROPion (WELLBUTRIN XL) 150 MG 24 hr tablet Take by mouth.    . Fe Cbn-Fe Gluc-FA-B12-C-DSS (FERRALET 90) 90-1 MG TABS Take 1 tablet by mouth daily. 90 each 3  . ipratropium (ATROVENT) 0.03 % nasal spray Place into the nose.    . levothyroxine (SYNTHROID, LEVOTHROID) 200 MCG tablet TAKE 1 TABLET BY MOUTH DAILY AT 6AM    . pravastatin (PRAVACHOL) 20 MG tablet Take 1 tablet (20 mg total) by mouth at bedtime. 90 tablet 3  . ramipril (ALTACE) 10 MG capsule Take 10 mg by mouth daily.    . ramipril (ALTACE) 10 MG capsule Take by mouth.    . ramipril (ALTACE) 10 MG capsule Take by mouth.    . Vitamins/Minerals TABS Take by mouth.    . sertraline (ZOLOFT) 50 MG tablet Take by mouth.    . sildenafil (VIAGRA) 100 MG tablet Take 1 tablet by mouth 30 minutes prior to intercourse    . zolpidem (AMBIEN) 10 MG tablet Take by mouth.     No current facility-administered medications for this encounter.    Physical Findings:  vitals were not taken for this visit.   /Unable to assess due to telephone follow up visit format.  Lab Findings: Lab Results  Component Value Date   WBC 10.0 10/12/2013   HGB 11.9 (L) 10/12/2013  HCT 36.0 (L) 10/12/2013   MCV 87.6 10/12/2013   PLT 345.0 10/12/2013     Radiographic Findings: MR LUMBAR SPINE WO CONTRAST  Result Date: 06/07/2019 CLINICAL DATA:  Worsening chronic low back pain. Multiple prior lumbar surgeries. History of thyroid, prostate, and colon cancer. EXAM: MRI LUMBAR SPINE WITHOUT CONTRAST TECHNIQUE: Multiplanar, multisequence MR imaging of the lumbar spine was performed. No intravenous contrast was administered. COMPARISON:  09/23/2013 FINDINGS: Segmentation:  Standard. Alignment: New 3 mm retrolisthesis of L1 on L2. Unchanged 3 mm retrolisthesis of L3 on L4. Vertebrae: Previous L2-S1 posterior and interbody fusion with evidence of solid arthrodesis based on a 07/07/2018 abdomen  and pelvis CT. No fracture or suspicious marrow lesion. Mild edema along the L1-2 endplates and anteriorly in the L1 vertebral body, likely degenerative. No evidence of discitis. Conus medullaris and cauda equina: Conus extends to the L1 level. Conus and cauda equina appear normal. Paraspinal and other soft tissues: Postoperative changes throughout the posterior lumbar soft tissues. No significant fluid collection. Disc levels: T11-12: Mild disc bulging and moderate facet hypertrophy without stenosis. T12-L1: Mild disc bulging and mild-to-moderate facet hypertrophy without stenosis. L1-2: New disc degeneration including mild-to-moderate disc space narrowing and retrolisthesis. Retrolisthesis, mild bulging of uncovered disc, and moderate facet and ligamentum flavum hypertrophy result in new mild spinal stenosis without neural foraminal stenosis. L2-3 to L5-S1: Previous posterior decompression and fusion with widely patent spinal canal. Left-sided neural foraminal narrowing appears mild at L4-5 and moderate at L5-S1 with assessment limited by susceptibility artifact at the latter. IMPRESSION: 1. 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 2. Previous L2-S1 fusion with widely patent spinal canal. 3. Mild L4-5 and moderate L5-S1 neural foraminal narrowing on the left. Electronically Signed   By: Logan Bores M.D.   On: 06/07/2019 06:39    Impression/Plan: 1. 74 y.o. gentleman with Stage T1c adenocarcinoma of the prostate with Gleason score of 3+3, and PSA of 5.69. He saw [name] for a follow-up visit last week and will continue to follow up with urology for ongoing PSA determinations.  His next follow-up appointment scheduled with Dr. Joelene Millin is in March 2021. He understands what to expect with regards to PSA monitoring going forward. I will look forward to following his response to treatment via correspondence with urology, and would be happy to continue to participate in his care  if clinically indicated. I talked to the patient about what to expect in the future, including his risk for erectile dysfunction and rectal bleeding. I encouraged him to call or return to the office if he has any questions regarding his previous radiation or possible radiation side effects. He was comfortable with this plan and will follow up as needed.     Nicholos Johns, PA-C
